# Patient Record
Sex: Female | Born: 1985 | Race: Black or African American | Hispanic: No | Marital: Married | Smoking: Former smoker
Health system: Southern US, Community
[De-identification: ages and names within clinical notes are randomized; demographics above are authoritative.]

## PROBLEM LIST (undated history)

## (undated) DIAGNOSIS — A6009 Herpesviral infection of other urogenital tract: Secondary | ICD-10-CM

## (undated) DIAGNOSIS — J45909 Unspecified asthma, uncomplicated: Secondary | ICD-10-CM

## (undated) DIAGNOSIS — E559 Vitamin D deficiency, unspecified: Secondary | ICD-10-CM

## (undated) DIAGNOSIS — F419 Anxiety disorder, unspecified: Secondary | ICD-10-CM

## (undated) HISTORY — DX: Vitamin D deficiency, unspecified: E55.9

## (undated) HISTORY — DX: Anxiety disorder, unspecified: F41.9

## (undated) HISTORY — DX: Unspecified asthma, uncomplicated: J45.909

---

## 2013-12-18 ENCOUNTER — Encounter (HOSPITAL_BASED_OUTPATIENT_CLINIC_OR_DEPARTMENT_OTHER): Payer: Self-pay | Admitting: *Deleted

## 2013-12-18 ENCOUNTER — Emergency Department (HOSPITAL_BASED_OUTPATIENT_CLINIC_OR_DEPARTMENT_OTHER): Payer: BC Managed Care – PPO

## 2013-12-18 ENCOUNTER — Emergency Department (HOSPITAL_BASED_OUTPATIENT_CLINIC_OR_DEPARTMENT_OTHER)
Admission: EM | Admit: 2013-12-18 | Discharge: 2013-12-18 | Disposition: A | Discharge status: Home / Self Care | Payer: BC Managed Care – PPO | Attending: Emergency Medicine | Admitting: Emergency Medicine

## 2013-12-18 DIAGNOSIS — R0602 Shortness of breath: Secondary | ICD-10-CM | POA: Diagnosis not present

## 2013-12-18 DIAGNOSIS — Z87891 Personal history of nicotine dependence: Secondary | ICD-10-CM | POA: Diagnosis not present

## 2013-12-18 DIAGNOSIS — R062 Wheezing: Secondary | ICD-10-CM | POA: Insufficient documentation

## 2013-12-18 DIAGNOSIS — Z880 Allergy status to penicillin: Secondary | ICD-10-CM | POA: Insufficient documentation

## 2013-12-18 DIAGNOSIS — Z8619 Personal history of other infectious and parasitic diseases: Secondary | ICD-10-CM | POA: Insufficient documentation

## 2013-12-18 DIAGNOSIS — Z79899 Other long term (current) drug therapy: Secondary | ICD-10-CM | POA: Insufficient documentation

## 2013-12-18 HISTORY — DX: Herpesviral infection of other urogenital tract: A60.09

## 2013-12-18 MED ORDER — PREDNISONE 50 MG PO TABS
60.0000 mg | ORAL_TABLET | Freq: Once | ORAL | Status: AC
  Administered 2013-12-18: 60 mg via ORAL
  Filled 2013-12-18 (×2): qty 1

## 2013-12-18 MED ORDER — ALBUTEROL SULFATE HFA 108 (90 BASE) MCG/ACT IN AERS
2.0000 | INHALATION_SPRAY | Freq: Four times a day (QID) | RESPIRATORY_TRACT | Status: DC | PRN
Start: 2013-12-18 — End: 2016-08-22

## 2013-12-18 MED ORDER — GUAIFENESIN-CODEINE 100-10 MG/5ML PO SYRP: 5.0000 mL | ORAL_SOLUTION | Freq: Three times a day (TID) | ORAL | Status: DC | PRN

## 2013-12-18 MED ORDER — PREDNISONE 20 MG PO TABS: ORAL_TABLET | ORAL | Status: DC

## 2013-12-18 NOTE — ED Notes (Signed)
Patient states she has been SOB for the past few days.

## 2013-12-18 NOTE — ED Provider Notes (Signed)
CSN: [id number]     Arrival date & time 12/18/13  1829 History   First MD Initiated Contact with Patient 12/18/13 1912     Chief Complaint  Patient presents with  . Shortness of Breath     (Consider location/radiation/quality/duration/timing/severity/associated sxs/prior Treatment) HPI   28 year old female presents complaining of shortness of breath. Patient reports for the nearly 2 weeks she has had recurrent episodes of increased shortness of breath and wheezing. Symptoms waxing and waning but she is using her rescue inhaler more than usual. In the past she would use her inhaler as needed which is once every 1-2 weeks however for the past 3-4 days she is using at least 3-4 times a day with some improvement. She reports a nonproductive cough, nasal congestion, runny nose. She denies any environmental changes but states that she thinks the weather is the controlled factor. Her friend has a dog that she has been exposed for the past 2 days. She denies any prior history of PE or DVT, no recent surgery, prolonged bed rest, unilateral leg swelling or calf pain, hemoptysis, or active cancer. Patient is currently on birth control. Patient states at one point she was diagnosed with asthma.  Past Medical History  Diagnosis Date  . Herpes genitalis in women    History reviewed. No pertinent past surgical history. No family history on file. History  Substance Use Topics  . Smoking status: Former Games developermoker  . Smokeless tobacco: Not on file  . Alcohol Use: No   OB History    No data available     Review of Systems  All other systems reviewed and are negative.     Allergies  Penicillins  Home Medications   Prior to Admission medications   Medication Sig Start Date End Date Taking? Authorizing Provider  albuterol (PROVENTIL HFA;VENTOLIN HFA) 108 (90 BASE) MCG/ACT inhaler Inhale 2 puffs into the lungs every 6 (six) hours as needed for wheezing or shortness of breath.   Yes Historical  Provider, MD  valACYclovir (VALTREX) 500 MG tablet Take 500 mg by mouth 2 (two) times daily.   Yes Historical Provider, MD   BP 134/82 mmHg  Pulse 86  Temp(Src) 97.6 F (36.4 C) (Oral)  Resp 18  Ht 5\' 3"  (1.6 m)  Wt 230 lb (104.327 kg)  BMI 40.75 kg/m2  SpO2 100% Physical Exam  Constitutional: She appears well-developed and well-nourished. No distress.  HENT:  Head: Atraumatic.  Right Ear: External ear normal.  Left Ear: External ear normal.  Rhinorrhea  Eyes: Conjunctivae are normal.  Neck: Neck supple.  Cardiovascular: Normal rate, regular rhythm and intact distal pulses.   Pulmonary/Chest: Effort normal. No respiratory distress. She has wheezes (Very faint expiratory wheezes without rales or rhonchi).  Abdominal: Soft. There is no tenderness.  Musculoskeletal: She exhibits no edema.  Neurological: She is alert.  Skin: No rash noted.  Psychiatric: She has a normal mood and affect.  Nursing note and vitals reviewed.   ED Course  Procedures (including critical care time)  8:40 PM Patient presents with shortness of breath for the past 10 day and occasional wheezing having to use her inhaler more than usual. This is likely to be a asthma exacerbation. Her chest x-ray shows no acute infiltrate concerning for pneumonia. Other she has no significant risk factors for PE aside from taking birth control pill given that her heart rate is normal, no evidence of hypoxia.  I discussed options of obtaining a d-dimer, however patient prefers watchful waiting.  We'll treat patients with steroid, inhaler, cough medication and strong return precaution given. Patient voiced understanding and agrees with plan. Patient will follow with her PCP as needed.  Labs Review Labs Reviewed - No data to display  Imaging Review Dg Chest 2 View  12/18/2013   CLINICAL DATA:  Short of breath for several days  EXAM: CHEST  2 VIEW  COMPARISON:  None.  FINDINGS: Normal mediastinum and cardiac silhouette. Normal  pulmonary vasculature. No evidence of effusion, infiltrate, or pneumothorax. No acute bony abnormality.  IMPRESSION: Normal chest radiograph   Electronically Signed   By: Genevive BiStewart  Edmunds M.D.   On: 12/18/2013 19:57     EKG Interpretation None      MDM   Final diagnoses:  SOB (shortness of breath)    BP 134/82 mmHg  Pulse 86  Temp(Src) 97.6 F (36.4 C) (Oral)  Resp 18  Ht 5\' 3"  (1.6 m)  Wt 230 lb (104.327 kg)  BMI 40.75 kg/m2  SpO2 100%  LMP 11/21/2013 (Approximate)  I have reviewed nursing notes and vital signs. I personally reviewed the imaging tests through PACS system  I reviewed available ER/hospitalization records thought the EMR     Fayrene HelperBowie Tyliek Timberman, PA-C 12/18/13 2043  Vanetta MuldersScott Zackowski, MD 12/19/13 1910

## 2013-12-18 NOTE — Discharge Instructions (Signed)

## 2016-07-14 ENCOUNTER — Ambulatory Visit: Payer: BC Managed Care – PPO | Admitting: Allergy & Immunology

## 2016-08-22 ENCOUNTER — Encounter: Payer: Self-pay | Admitting: Allergy

## 2016-08-22 ENCOUNTER — Ambulatory Visit (INDEPENDENT_AMBULATORY_CARE_PROVIDER_SITE_OTHER): Payer: BC Managed Care – PPO | Admitting: Allergy

## 2016-08-22 VITALS — BP 128/72 | HR 68 | Temp 97.8°F | Resp 19 | Ht 63.5 in | Wt 252.0 lb

## 2016-08-22 DIAGNOSIS — J310 Chronic rhinitis: Secondary | ICD-10-CM | POA: Diagnosis not present

## 2016-08-22 DIAGNOSIS — J309 Allergic rhinitis, unspecified: Secondary | ICD-10-CM | POA: Diagnosis not present

## 2016-08-22 DIAGNOSIS — H1010 Acute atopic conjunctivitis, unspecified eye: Secondary | ICD-10-CM | POA: Diagnosis not present

## 2016-08-22 DIAGNOSIS — J4530 Mild persistent asthma, uncomplicated: Secondary | ICD-10-CM | POA: Diagnosis not present

## 2016-08-22 DIAGNOSIS — T781XXD Other adverse food reactions, not elsewhere classified, subsequent encounter: Secondary | ICD-10-CM

## 2016-08-22 MED ORDER — MONTELUKAST SODIUM 10 MG PO TABS: 10.0000 mg | ORAL_TABLET | Freq: Every day | ORAL | 5 refills | Status: DC

## 2016-08-22 MED ORDER — ALBUTEROL SULFATE HFA 108 (90 BASE) MCG/ACT IN AERS: 2.0000 | INHALATION_SPRAY | Freq: Four times a day (QID) | RESPIRATORY_TRACT | 5 refills | Status: DC | PRN

## 2016-08-22 MED ORDER — LEVOCETIRIZINE DIHYDROCHLORIDE 5 MG PO TABS: 5.0000 mg | ORAL_TABLET | Freq: Every evening | ORAL | 5 refills | Status: DC

## 2016-08-22 MED ORDER — TRIAMCINOLONE ACETONIDE 55 MCG/ACT NA AERO: 2.0000 | INHALATION_SPRAY | Freq: Every day | NASAL | 5 refills | Status: DC

## 2016-08-22 MED ORDER — IPRATROPIUM BROMIDE 0.06 % NA SOLN: 2.0000 | Freq: Four times a day (QID) | NASAL | 5 refills | Status: DC

## 2016-08-22 NOTE — Patient Instructions (Addendum)
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   Your milk serum IgE and cheese (moldy type) IgE were negative and skin testing to milk is negative.  Thus you do not have IgE mediated food allergy to dairy.     - skin testing for tomato and shrimp were negative but you do have serum IgE detectable levels to both tomato and shrimp thus this could represent IgE mediated food allergy.  Would avoid these foods at this time and have access to your Epipen.      - will prescribe nasal Atrovent to use 2 sprays as needed up to 3-4 times a day.  Use prior to known cheese ingestion.    Exercise-induced asthma/Reactive airway disease   - start Singulair 10mg  daily-- take at bedtime   - have access to albuterol inhaler 2 puffs every 4-6 hours as needed for cough/wheeze/shortness of breath/chest tightness.  May use 15-20 minutes prior to activity.   Monitor frequency of use.   Asthma control goals:   Full participation in all desired activities (may need albuterol before activity)  Albuterol use two time or less a week on average (not counting use with activity)  Cough interfering with sleep two time or less a month  Oral steroids no more than once a year  No hospitalizations   Allergic rhinitis    - stop Claritin and change to Xyzal 5mg  daily    - for nasal congestion and drainagrecommend use Nasacort 2 sprays each nostril daily    - allergy skin prick testing was positive for grasses, weeds, trees, 1 mold (botrytis cinera), dust mites, dog ,cockroach.   Allergen avoidance measures provided.    Follow-up 6 months or sooner if needed

## 2016-08-22 NOTE — Progress Notes (Signed)
New Patient Note  RE: Belinda Barton MRN: [id number] DOB: Dec 11, 1985 Date of Office Visit: 08/22/2016  Referring provider: Dorothyann PengSanders, Robyn, MD Primary care provider: Dorothyann PengSanders, Robyn, MD  Chief Complaint: allergies    History of present illness: Belinda Barton is a 31 y.o. female presenting today for consultation for allergies.   She states around age 31 she started having issues with certain foods causing symptoms.   She reports with any type of cheese ingestion she gets increased nasal drainage.  In the winter time she reports after cheese ingestion she can not breathe.  She still likes to eat cheese.   She reports even with cream cheese and sour cream she will experience the symptoms.  Tomato based sauces she would develop heartburn symptoms, stomach pain and nausea.  Shrimp ingestion makes her stomach hurt as well and she developed increased flatulence.   She eats other shellfish without any issues.  She does try to stay away from tomato-based products and shrimp as much as possible. She had blood testing done for food allergy in 10/17 and many foods on the panel had detectable IgE levels (see results below) even though she eats most of these foods without any symptoms.   She was prescribed an Epipen which she carries with her.   She reports nasal congestion and drainage at other times outside of cheese ingestion however does not notice a seasonal change.  She also notes itchy, watery eyes around her friend's dog and she will usually just leave when symptoms start.  She has been taking daily Claritin and feels it is not that helpful.    She has noticed when seasons change especially in winter she has more wheezing and difficulty breathing.    She also feels her work environment may be playing a role as it is old and she reports that sometimes down.  She has a history of exercise induced asthma.  She does not have albuterol inhaler and had to borrow someone else's albuterol which she did find  relief this past winter.  She was prescribed an albuterol inhaler from her PCP and April however she did not fill this.q She did need to go to ED 3 years ago for wheezing during winter where she was treated with breathing treatments..   She denied having the steroids prescribed to her at this ED visit as she was told there may be some interaction with her birth control. She denies any nighttime awakenings.  She has never been on a daily controller medicine.   Review of systems: Review of Systems  Constitutional: Negative for chills and fever.  HENT: Positive for congestion. Negative for ear discharge, ear pain, nosebleeds, sinus pain, sore throat and tinnitus.   Eyes: Negative for discharge and redness.  Respiratory: Negative for cough, shortness of breath and wheezing.   Cardiovascular: Negative for chest pain.  Gastrointestinal: Positive for heartburn. Negative for abdominal pain, constipation, diarrhea, nausea and vomiting.  Musculoskeletal: Negative for joint pain.  Skin: Negative for itching and rash.  Neurological: Negative for headaches.    All other systems negative unless noted above in HPI  Past medical history: Past Medical History:  Diagnosis Date  . Herpes genitalis in women   . Vitamin D deficiency     Past surgical history: History reviewed. No pertinent surgical history.  Family history:  Family History  Problem Relation Age of Onset  . Diabetes Mother   . Asthma Brother     Social history: She lives in a  condo with carpeting in the bedroom with gas heating and central cooling. There are no pets in the home. There are dogs and cats outside the home. There is no concern for water damage, mild to her wishes in the home. She is a Runner, broadcasting/film/videoteacher.  She does smoke clover ~ 1 pack/week for past 4 years.     Medication List: Allergies as of 08/22/2016      Reactions   Penicillins       Medication List       Accurate as of 08/22/16  1:06 PM. Always use your most recent  med list.          JUNEL FE 1/20 1-20 MG-MCG tablet Generic drug:  norethindrone-ethinyl estradiol TAKE 1 TABLET BY MOUTH EVERY DAY FOR 21 DAYS, THEN DO NOT TAKE PLACEBO PILLS, TABLET-FREE FOR 7 DAYS   valACYclovir 500 MG tablet Commonly known as:  VALTREX Take 500 mg by mouth 2 (two) times daily.   Vitamin D (Ergocalciferol) 50000 units Caps capsule Commonly known as:  DRISDOL       Known medication allergies: Allergies  Allergen Reactions  . Penicillins      Physical examination: Blood pressure 128/72, pulse 68, temperature 97.8 F (36.6 C), resp. rate 19, height 5' 3.5" (1.613 m), weight 252 lb (114.3 kg), SpO2 97 %.  General: Alert, interactive, in no acute distress. HEENT: PERRLA, TMs pearly gray, turbinates markedly edematous and pale with clear discharge, post-pharynx non erythematous. Neck: Supple without lymphadenopathy. Lungs: Clear to auscultation without wheezing, rhonchi or rales. {no increased work of breathing. CV: Normal S1, S2 without murmurs. Abdomen: Nondistended, nontender. Skin: Warm and dry, without lesions or rashes. Extremities:  No clubbing, cyanosis or edema. Neuro:   Grossly intact.  Diagnositics/Labs: Labs: Review of labs obtained by PCP from October 2017 include low vitamin D level, normal TSH, unremarkable CMP, unremarkable CBC.   Food allergy serum IgE levels were detected at moderate to low levels for wheat, rye, oat, corn, rice, peanut, soybean, almond, carrot, orange, white potato, strawberry, garlic, onions, apple, melons, banana, avocado, lemon, cabbage, grape, broccoli, chilli pepper, black pepper, green bean  -- all of which she eats and tolerates There was negative serum IgE testing to both milk and cheese, moldy type. There is detectable level for shrimp at 0.16, tomato at 0.6 Please see scanned document for detailed lab results  Spirometry: FEV1: 1.97L  79%, FVC: 2.7L  93%, ratio consistent with Nonobstructive pattern  Allergy  testing: Skin prick testing for selected foods were negative for milk, tomato and shrimp Environmental allergy skin prick testing is positive for grasses, trees, dust mite, dog botytis cinera,and  Cockroach Intradermal testing was positive for ragweed mix and weed mix Allergy testing results were read and interpreted by provider, documented by clinical staff.   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   Your milk serum IgE and cheese (moldy type) IgE were negative and skin testing to milk is negative.  Thus you do not have IgE mediated food allergy to dairy.     - skin testing for tomato and shrimp were negative but you do have serum IgE detectable levels to both tomato and shrimp thus this could represent IgE mediated food allergy.  Would avoid these foods at this time and have access to your Epipen.  Emergency action plan provided today.    - Advised that all of the other foods  that have low level detectable IgE levels are likely false positives as she consumes these foods without any issue and to resume in the diet    - will prescribe nasal Atrovent to use 2 sprays as needed up to 3-4 times a day.  Use prior to known cheese ingestion.    Exercise-induced asthma/Reactive airway disease   - start Singulair 10mg  daily-- take at bedtime   - have access to albuterol inhaler 2 puffs every 4-6 hours as needed for cough/wheeze/shortness of breath/chest tightness.  May use 15-20 minutes prior to activity.   Monitor frequency of use.   Asthma control goals:   Full participation in all desired activities (may need albuterol before activity)  Albuterol use two time or less a week on average (not counting use with activity)  Cough interfering with sleep two time or less a month  Oral steroids no more than once a year  No hospitalizations   Allergic rhinoconjunctivitis    - stop Claritin and change to  Xyzal 5mg  daily    - for nasal congestion and drainagrecommend use Nasacort 2 sprays each nostril daily    - allergy skin prick testing was positive for grasses, weeds, trees, 1 mold (botrytis cinera), dust mites, dog ,cockroach.   Allergen avoidance measures provided.      - Offered to prescribe an antihistamine eyedrop for possible dog exposure however she states it only happens with her friend's dog and she can avoidthe dog  Follow-up 6 months or sooner if needed  I appreciate the opportunity to take part in Licia's care. Please do not hesitate to contact me with questions.  Sincerely,   Margo Aye, MD Allergy/Immunology [facility]

## 2016-08-26 ENCOUNTER — Telehealth: Payer: Self-pay

## 2016-08-26 MED ORDER — ALBUTEROL SULFATE 108 (90 BASE) MCG/ACT IN AEPB: 2.0000 | INHALATION_SPRAY | Freq: Four times a day (QID) | RESPIRATORY_TRACT | 1 refills | Status: DC | PRN

## 2016-08-26 NOTE — Telephone Encounter (Signed)
Left detailed message asking for call back about medications.

## 2016-08-26 NOTE — Telephone Encounter (Signed)
Do you know if she is referring to the Proair respiclick not working or Magazine features editorall Proair.  If she doesn't feel the respiclick didn't work it could be the device and would go ahead and order the Proair HFA otherwise can order the levalbuterol if covered.

## 2016-08-26 NOTE — Telephone Encounter (Signed)
Patient returned call. Stated that she had the hfa inhaler before, she believes. Is willing to try the respiclick. I have sent that to her pharmacy. She will let me know if she is okay with this one or not.

## 2016-08-26 NOTE — Telephone Encounter (Signed)
Patient's insurance does not cover Ventolin HFA. Insurance prefers levalbuterol aer 45/act, Proair HFA, Proair Respi Aer. Patient prefers ventolin because she states that the Avon ProductsProair doesn't work.    Please advise.

## 2016-08-26 NOTE — Addendum Note (Signed)
Addended by: Mliss FritzBLACK, Berneita Sanagustin I on: 08/26/2016 05:22 PM   Modules accepted: Orders

## 2017-02-23 ENCOUNTER — Other Ambulatory Visit: Payer: Self-pay

## 2017-02-23 DIAGNOSIS — J4530 Mild persistent asthma, uncomplicated: Secondary | ICD-10-CM

## 2017-02-23 DIAGNOSIS — J309 Allergic rhinitis, unspecified: Principal | ICD-10-CM

## 2017-02-23 DIAGNOSIS — H1010 Acute atopic conjunctivitis, unspecified eye: Secondary | ICD-10-CM

## 2017-02-23 MED ORDER — LEVOCETIRIZINE DIHYDROCHLORIDE 5 MG PO TABS: 5.0000 mg | ORAL_TABLET | Freq: Every evening | ORAL | 0 refills | Status: DC

## 2017-02-23 MED ORDER — MONTELUKAST SODIUM 10 MG PO TABS: 10.0000 mg | ORAL_TABLET | Freq: Every day | ORAL | 0 refills | Status: DC

## 2017-02-23 NOTE — Telephone Encounter (Signed)
Courtesy refill sent. Pt needs an office visit.

## 2017-03-27 ENCOUNTER — Other Ambulatory Visit: Payer: Self-pay | Admitting: Allergy

## 2017-03-27 DIAGNOSIS — J309 Allergic rhinitis, unspecified: Principal | ICD-10-CM

## 2017-03-27 DIAGNOSIS — J4530 Mild persistent asthma, uncomplicated: Secondary | ICD-10-CM

## 2017-03-27 DIAGNOSIS — H1010 Acute atopic conjunctivitis, unspecified eye: Secondary | ICD-10-CM

## 2017-05-07 ENCOUNTER — Telehealth: Payer: Self-pay | Admitting: Allergy

## 2017-05-07 DIAGNOSIS — H1010 Acute atopic conjunctivitis, unspecified eye: Secondary | ICD-10-CM

## 2017-05-07 DIAGNOSIS — J4530 Mild persistent asthma, uncomplicated: Secondary | ICD-10-CM

## 2017-05-07 DIAGNOSIS — J309 Allergic rhinitis, unspecified: Principal | ICD-10-CM

## 2017-05-07 MED ORDER — LEVOCETIRIZINE DIHYDROCHLORIDE 5 MG PO TABS: 5.0000 mg | ORAL_TABLET | Freq: Every evening | ORAL | 0 refills | Status: DC

## 2017-05-07 MED ORDER — ALBUTEROL SULFATE 108 (90 BASE) MCG/ACT IN AEPB: 2.0000 | INHALATION_SPRAY | Freq: Four times a day (QID) | RESPIRATORY_TRACT | 1 refills | Status: DC | PRN

## 2017-05-07 MED ORDER — MONTELUKAST SODIUM 10 MG PO TABS: 10.0000 mg | ORAL_TABLET | Freq: Every day | ORAL | 0 refills | Status: DC

## 2017-05-07 NOTE — Telephone Encounter (Signed)
Refilled 3 medications for patient

## 2017-05-07 NOTE — Telephone Encounter (Signed)
Patient needs a refill called in for all medications - pharmacy told the pat to contact the office to get refills Patient has upcoming appt 05/21/2017

## 2017-05-21 ENCOUNTER — Ambulatory Visit: Payer: BC Managed Care – PPO | Admitting: Allergy

## 2017-05-21 ENCOUNTER — Encounter: Payer: Self-pay | Admitting: Allergy

## 2017-05-21 VITALS — BP 124/84 | HR 72 | Resp 16

## 2017-05-21 DIAGNOSIS — H1010 Acute atopic conjunctivitis, unspecified eye: Secondary | ICD-10-CM

## 2017-05-21 DIAGNOSIS — J4530 Mild persistent asthma, uncomplicated: Secondary | ICD-10-CM

## 2017-05-21 DIAGNOSIS — J310 Chronic rhinitis: Secondary | ICD-10-CM

## 2017-05-21 DIAGNOSIS — J309 Allergic rhinitis, unspecified: Secondary | ICD-10-CM | POA: Diagnosis not present

## 2017-05-21 NOTE — Patient Instructions (Addendum)
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    Your milk serum IgE and cheese (moldy type) IgE were negative and skin testing to milk is negative.  Thus you do not have IgE mediated food allergy to dairy.     - skin testing previously for tomato and shrimp were negative but you did have serum IgE detectable levels to both tomato and shrimp thus this could represent IgE mediated food allergy.  Would avoid these foods at this time and have access to your Epipen.      -use nasal Atrovent 2 sprays as needed up to 3-4 times a day if your going to eat cheese or other foods or situations where you develop nasal congestion and drainage.    Exercise-induced asthma/Reactive airway disease   - continue Singulair 10mg  daily-- take at bedtime   - have access to albuterol inhaler 2 puffs every 4-6 hours as needed for cough/wheeze/shortness of breath/chest tightness.  May use 15-20 minutes prior to activity.   Monitor frequency of use.   Asthma control goals:   Full participation in all desired activities (may need albuterol before activity)  Albuterol use two time or less a week on average (not counting use with activity)  Cough interfering with sleep two time or less a month  Oral steroids no more than once a year  No hospitalizations  Allergic rhinitis    - continue  Xyzal 5mg  daily    - for nasal congestion and drainage recommend use of Nasacort 2 sprays each nostril daily    - continue allergen avoidance grasses, weeds, trees, mold (botrytis cinera), dust mites, dog ,cockroach.     Follow-up 6 months or sooner if needed

## 2017-05-21 NOTE — Progress Notes (Signed)
Follow-up Note  RE: Belinda Barton MRN: [id number] DOB: [birth year] Date of Office Visit: 05/21/2017   History of present illness: Belinda Barton is a 32 y.o. female presenting today for follow-up of gustatory rhinitis, exercise-induced asthma, allergic rhinocnjunctivitis.  She was last seen in the office on 08/22/16 by myself.  She denies any major health changes, surgeries or hospitalizations in the past year.  She does states it is hard for her to stay away from cheese.  She has been using which she thinks is atrovent nasal spray and states it does dry up her nasal secretions with dairy ingestion.   She states she definitely can tell as difference in her allergy symptoms with use of Xyzal and singulair.  She states she did run out of medications about a month or so ago and was able to get a courtesy refill from the office.  She states when she was out of the meds briefly she had wheezing.  This has improved with resuming her medication management.  She states she has needed to use albuterol about once a week.  Denies any nighttime awakenings.  No ED/UC visits and no oral steroid needs since last visit.   She does not think she has the Nasacort as she only has 1 nasal spray which she states was a prescription.  She also continues to avoid her friend's home who has a dog that will usually will cause her to have itchy/watery eyes.  Since she has been avoiding this home she has not needed to use an eyedrop.     Review of systems: Review of Systems  Constitutional: Negative for chills, fever and malaise/fatigue.  HENT: Positive for congestion. Negative for ear discharge, ear pain, nosebleeds, sinus pain and sore throat.   Eyes: Negative for pain, discharge and redness.  Respiratory: Positive for wheezing. Negative for cough, sputum production and shortness of breath.   Cardiovascular: Negative for chest pain.  Gastrointestinal: Negative for abdominal pain, constipation, diarrhea, heartburn,  nausea and vomiting.  Musculoskeletal: Negative for joint pain.  Skin: Negative for itching and rash.  Neurological: Negative for headaches.    All other systems negative unless noted above in HPI  Past medical/social/surgical/family history have been reviewed and are unchanged unless specifically indicated below.  No changes  Medication List: Allergies as of 05/21/2017      Reactions   Penicillins       Medication List        Accurate as of 05/21/17  6:05 PM. Always use your most recent med list.          Albuterol Sulfate 108 (90 Base) MCG/ACT Aepb Commonly known as:  PROAIR RESPICLICK Inhale 2 puffs into the lungs every 6 (six) hours as needed.   ipratropium 0.06 % nasal spray Commonly known as:  ATROVENT Place 2 sprays into both nostrils 4 (four) times daily.   JUNEL FE 1/20 1-20 MG-MCG tablet Generic drug:  norethindrone-ethinyl estradiol TAKE 1 TABLET BY MOUTH EVERY DAY FOR 21 DAYS, THEN DO NOT TAKE PLACEBO PILLS, TABLET-FREE FOR 7 DAYS   levocetirizine 5 MG tablet Commonly known as:  XYZAL Take 1 tablet (5 mg total) by mouth every evening.   montelukast 10 MG tablet Commonly known as:  SINGULAIR Take 1 tablet (10 mg total) by mouth at bedtime.   triamcinolone 55 MCG/ACT Aero nasal inhaler Commonly known as:  NASACORT Place 2 sprays into the nose daily.   valACYclovir 500 MG tablet Commonly known as:  VALTREX Take 500  mg by mouth 2 (two) times daily.   Vitamin D (Ergocalciferol) 50000 units Caps capsule Commonly known as:  DRISDOL       Known medication allergies: Allergies  Allergen Reactions  . Penicillins      Physical examination: Blood pressure 124/84, pulse 72, resp. rate 16.  General: Alert, interactive, in no acute distress. HEENT: PERRLA, TMs pearly gray, turbinates mildly edematous with clear discharge, post-pharynx non erythematous. Neck: Supple without lymphadenopathy. Lungs: Clear to auscultation without wheezing, rhonchi or  rales. {no increased work of breathing. CV: Normal S1, S2 without murmurs. Abdomen: Nondistended, nontender. Skin: Warm and dry, without lesions or rashes. Extremities:  No clubbing, cyanosis or edema. Neuro:   Grossly intact.  Diagnositics/Labs:  Spirometry: FEV1: 2.28L  83%, FVC: 2.88L  89%, ratio consistent with nonobstructive pattern  Assessment and plan:   Adverse food reaction/GUSTATORY RHINITIS     - 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    Your milk serum IgE and cheese (moldy type) IgE were negative and skin testing to milk is negative.  Thus you do not have IgE mediated food allergy to dairy.     - skin testing previously for tomato and shrimp were negative but you did have serum IgE detectable levels to both tomato and shrimp thus this could represent IgE mediated food allergy.  Would avoid these foods at this time and have access to your Epipen.      -use nasal Atrovent 2 sprays as needed up to 3-4 times a day if your going to eat cheese or other foods or situations where you develop nasal congestion and drainage.    Exercise-induced asthma/Reactive airway disease   - continue Singulair 10mg  daily-- take at bedtime   - have access to albuterol inhaler 2 puffs every 4-6 hours as needed for cough/wheeze/shortness of breath/chest tightness.  May use 15-20 minutes prior to activity.   Monitor frequency of use.   Asthma control goals:   Full participation in all desired activities (may need albuterol before activity)  Albuterol use two time or less a week on average (not counting use with activity)  Cough interfering with sleep two time or less a month  Oral steroids no more than once a year  No hospitalizations  Allergic rhinitis    - continue  Xyzal 5mg  daily    - for nasal congestion and drainage recommend use of Nasacort 2 sprays each nostril daily    - continue allergen avoidance grasses, weeds, trees, mold (botrytis cinera), dust mites, dog  ,cockroach.     Follow-up 6 months or sooner if needed  I appreciate the opportunity to take part in Mallorie's care. Please do not hesitate to contact me with questions.  Sincerely,   Margo AyeShaylar Haylyn Halberg, MD Allergy/Immunology [facility]

## 2017-05-22 NOTE — Addendum Note (Signed)
Addended by: Dub MikesHICKS, Terrius Gentile N on: 05/22/2017 04:05 PM   Modules accepted: Orders

## 2017-06-11 ENCOUNTER — Other Ambulatory Visit: Payer: Self-pay | Admitting: Allergy

## 2017-06-11 ENCOUNTER — Other Ambulatory Visit: Payer: Self-pay | Admitting: *Deleted

## 2017-06-11 DIAGNOSIS — J4530 Mild persistent asthma, uncomplicated: Secondary | ICD-10-CM

## 2017-06-11 DIAGNOSIS — H1010 Acute atopic conjunctivitis, unspecified eye: Secondary | ICD-10-CM

## 2017-06-11 DIAGNOSIS — J309 Allergic rhinitis, unspecified: Principal | ICD-10-CM

## 2017-06-11 MED ORDER — MONTELUKAST SODIUM 10 MG PO TABS: 10.0000 mg | ORAL_TABLET | Freq: Every day | ORAL | 5 refills | Status: DC

## 2017-06-11 MED ORDER — LEVOCETIRIZINE DIHYDROCHLORIDE 5 MG PO TABS
5.0000 mg | ORAL_TABLET | Freq: Every evening | ORAL | 5 refills | Status: DC
Start: 2017-06-11 — End: 2018-04-28

## 2017-11-10 ENCOUNTER — Other Ambulatory Visit: Payer: Self-pay | Admitting: Nurse Practitioner

## 2017-11-20 ENCOUNTER — Encounter: Payer: Self-pay | Admitting: Allergy

## 2017-11-20 ENCOUNTER — Ambulatory Visit (INDEPENDENT_AMBULATORY_CARE_PROVIDER_SITE_OTHER): Payer: BC Managed Care – PPO | Admitting: Allergy

## 2017-11-20 VITALS — BP 124/90 | HR 70 | Resp 20 | Ht 63.0 in | Wt 255.2 lb

## 2017-11-20 DIAGNOSIS — T781XXD Other adverse food reactions, not elsewhere classified, subsequent encounter: Secondary | ICD-10-CM

## 2017-11-20 DIAGNOSIS — J310 Chronic rhinitis: Secondary | ICD-10-CM | POA: Diagnosis not present

## 2017-11-20 DIAGNOSIS — J4530 Mild persistent asthma, uncomplicated: Secondary | ICD-10-CM | POA: Diagnosis not present

## 2017-11-20 DIAGNOSIS — H1010 Acute atopic conjunctivitis, unspecified eye: Secondary | ICD-10-CM

## 2017-11-20 DIAGNOSIS — J309 Allergic rhinitis, unspecified: Secondary | ICD-10-CM | POA: Diagnosis not present

## 2017-11-20 MED ORDER — ALBUTEROL SULFATE HFA 108 (90 BASE) MCG/ACT IN AERS: 2.0000 | INHALATION_SPRAY | Freq: Four times a day (QID) | RESPIRATORY_TRACT | 1 refills | Status: DC | PRN

## 2017-11-20 MED ORDER — EPINEPHRINE 0.3 MG/0.3ML IJ SOAJ: 0.3000 mg | Freq: Once | INTRAMUSCULAR | 2 refills | Status: AC

## 2017-11-20 MED ORDER — BECLOMETHASONE DIPROP HFA 80 MCG/ACT IN AERB: 2.0000 | INHALATION_SPRAY | Freq: Two times a day (BID) | RESPIRATORY_TRACT | 5 refills | Status: DC

## 2017-11-20 NOTE — Patient Instructions (Addendum)
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    Your milk serum IgE and cheese (moldy type) IgE were negative and skin testing to milk is negative.  Thus you do not have IgE mediated food allergy to dairy.     - skin testing previously for tomato and shrimp were negative but you did have serum IgE detectable levels to both tomato and shrimp thus this could represent IgE mediated food allergy.  Would avoid these foods at this time and have access to your Epipen.      -use nasal Atrovent 2 sprays as needed up to 3-4 times a day if your going to eat cheese or other foods or situations where you develop nasal congestion and drainage.    Exercise-induced asthma/Reactive airway disease   - continue Singulair 10mg  daily-- take at bedtime   - start Qvar 2 puffs twice a day   - have access to albuterol inhaler 2 puffs every 4-6 hours as needed for cough/wheeze/shortness of breath/chest tightness.  May use 15-20 minutes prior to activity.   Monitor frequency of use.   Asthma control goals:   Full participation in all desired activities (may need albuterol before activity)  Albuterol use two time or less a week on average (not counting use with activity)  Cough interfering with sleep two time or less a month  Oral steroids no more than once a year  No hospitalizations  Allergic rhinitis    - continue  Xyzal 5mg  daily    - for nasal congestion and drainage recommend use of Nasacort 2 sprays each nostril daily    - continue allergen avoidance grasses, weeds, trees, mold (botrytis cinera), dust mites, dog, cockroach.     Follow-up 6 months or sooner if needed

## 2017-11-20 NOTE — Progress Notes (Signed)
Follow-up Note  RE: Belinda Barton MRN: [id number] DOB: 07-12-1985 Date of Office Visit: 11/20/2017   History of present illness: Belinda Barton is a 32 y.o. female presenting today for follow-up of gustatory rhinitis, reactive airway disease and allergic rhinitis.  She was last seen in the office on May 21, 2017 by myself.  She denies any major health changes, surgeries or hospitalizations since her last visit.  She does state that since the weather has become cooler she notices that if she is out in the cool weather she developed shortness of breath.  She states she has needed to use her albuterol about 3 times this week due to the shortness of breath with cold exposure.  She does state that Proventil works the best for her as far as albuterol relief.  She does take Singulair daily.  She states she has not required any ED or urgent care visits or any oral steroid needs since her last visit. She tries her best to avoid cheese products as she knows this will cause nasal drainage.  She states she now seems to have similar symptoms with sour cream.  She also tries to avoid tomato and shrimp.  She does have nasal atrovent which does help sometimes with nasal drainage.    With her allergic rhinitis she states she does not recall if she tried use of xyzal.  She does use nasacort as needed for nasal congestion.    Review of systems: Review of Systems  Constitutional: Negative for chills, fever and malaise/fatigue.  HENT: Positive for congestion. Negative for ear discharge, ear pain, nosebleeds and sore throat.   Eyes: Negative for pain, discharge and redness.  Respiratory: Positive for shortness of breath. Negative for cough, sputum production and wheezing.   Gastrointestinal: Negative for abdominal pain, constipation, diarrhea, heartburn, nausea and vomiting.  Musculoskeletal: Negative for joint pain.  Skin: Negative for itching and rash.  Neurological: Negative for headaches.    All other  systems negative unless noted above in HPI  Past medical/social/surgical/family history have been reviewed and are unchanged unless specifically indicated below.  No changes  Medication List: Allergies as of 11/20/2017      Reactions   Penicillins       Medication List        Accurate as of 11/20/17  4:23 PM. Always use your most recent med list.          Albuterol Sulfate 108 (90 Base) MCG/ACT Aepb Inhale 2 puffs into the lungs every 6 (six) hours as needed.   albuterol 108 (90 Base) MCG/ACT inhaler Commonly known as:  PROVENTIL HFA;VENTOLIN HFA Inhale 2 puffs into the lungs every 6 (six) hours as needed for wheezing or shortness of breath.   beclomethasone 80 MCG/ACT inhaler Commonly known as:  QVAR Inhale 2 puffs into the lungs 2 (two) times daily.   EPINEPHrine 0.3 mg/0.3 mL Soaj injection Commonly known as:  EPI-PEN Inject 0.3 mLs (0.3 mg total) into the muscle once for 1 dose. As directed for life-threatening allergic reactions   ipratropium 0.06 % nasal spray Commonly known as:  ATROVENT Place 2 sprays into both nostrils 4 (four) times daily.   JUNEL FE 1/20 1-20 MG-MCG tablet Generic drug:  norethindrone-ethinyl estradiol TAKE 1 TABLET BY MOUTH EVERY DAY FOR 21 DAYS, THEN DO NOT TAKE PLACEBO PILLS, TABLET-FREE FOR 7 DAYS   levocetirizine 5 MG tablet Commonly known as:  XYZAL Take 1 tablet (5 mg total) by mouth every evening.   levocetirizine  5 MG tablet Commonly known as:  XYZAL TAKE 1 TABLET BY MOUTH EVERY DAY IN THE EVENING   montelukast 10 MG tablet Commonly known as:  SINGULAIR Take 1 tablet (10 mg total) by mouth at bedtime.   montelukast 10 MG tablet Commonly known as:  SINGULAIR TAKE 1 TABLET BY MOUTH EVERYDAY AT BEDTIME   triamcinolone 55 MCG/ACT Aero nasal inhaler Commonly known as:  NASACORT Place 2 sprays into the nose daily.   valACYclovir 500 MG tablet Commonly known as:  VALTREX Take 500 mg by mouth 2 (two) times daily.     Vitamin D (Ergocalciferol) 50000 units Caps capsule Commonly known as:  DRISDOL       Known medication allergies: Allergies  Allergen Reactions  . Penicillins      Physical examination: Blood pressure 124/90, pulse 70, resp. rate 20, height 5\' 3"  (1.6 m), weight 255 lb 3.2 oz (115.8 kg), SpO2 99 %.  General: Alert, interactive, in no acute distress. HEENT: PERRLA, TMs pearly gray, turbinates minimally edematous without discharge, post-pharynx non erythematous. Neck: Supple without lymphadenopathy. Lungs: Clear to auscultation without wheezing, rhonchi or rales. {no increased work of breathing. CV: Normal S1, S2 without murmurs. Abdomen: Nondistended, nontender. Skin: Warm and dry, without lesions or rashes. Extremities:  No clubbing, cyanosis or edema. Neuro:   Grossly intact.  Diagnositics/Labs:  Spirometry: FEV1: 2.13L 81%, FVC: 2.82L 91%, ratio consistent with nonobstructive pattern  Assessment and plan:   Adverse food reaction/GUSTATORY RHINITIS     - 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    Your milk serum IgE and cheese (moldy type) IgE were negative and skin testing to milk was negative.  Thus you do not have IgE mediated food allergy to dairy.     - skin testing previously for tomato and shrimp were negative but you did have serum IgE detectable levels to both tomato and shrimp thus this could represent IgE mediated food allergy.  Would avoid these foods at this time and have access to your Epipen.      -use nasal Atrovent 2 sprays as needed up to 3-4 times a day if your going to eat cheese or other foods or situations where you develop nasal congestion and drainage.    Exercise-induced asthma/Reactive airway disease   - continue Singulair 10mg  daily-- take at bedtime   - start Qvar 2 puffs twice a day   - have access to albuterol inhaler 2 puffs every 4-6 hours as needed for cough/wheeze/shortness of breath/chest tightness.  May use  15-20 minutes prior to activity.   Monitor frequency of use.   Asthma control goals:   Full participation in all desired activities (may need albuterol before activity)  Albuterol use two time or less a week on average (not counting use with activity)  Cough interfering with sleep two time or less a month  Oral steroids no more than once a year  No hospitalizations  Allergic rhinitis    - continue  Xyzal 5mg  daily    - for nasal congestion and drainage recommend use of Nasacort 2 sprays each nostril daily    - continue allergen avoidance grasses, weeds, trees, mold (botrytis cinera), dust mites, dog, cockroach.     Follow-up 6 months or sooner if needed I appreciate the opportunity to take part in Belinda Barton's care. Please do not hesitate to contact me with questions.  Sincerely,   Margo Aye, MD Allergy/Immunology [facility]

## 2017-12-10 ENCOUNTER — Other Ambulatory Visit: Payer: Self-pay | Admitting: *Deleted

## 2017-12-10 DIAGNOSIS — H1010 Acute atopic conjunctivitis, unspecified eye: Secondary | ICD-10-CM

## 2017-12-10 DIAGNOSIS — J309 Allergic rhinitis, unspecified: Principal | ICD-10-CM

## 2017-12-10 MED ORDER — LEVOCETIRIZINE DIHYDROCHLORIDE 5 MG PO TABS: ORAL_TABLET | ORAL | 5 refills | Status: DC

## 2017-12-11 ENCOUNTER — Other Ambulatory Visit: Payer: Self-pay | Admitting: Allergy

## 2017-12-14 ENCOUNTER — Encounter: Payer: BC Managed Care – PPO | Admitting: Nurse Practitioner

## 2018-01-09 ENCOUNTER — Other Ambulatory Visit: Payer: Self-pay | Admitting: Allergy

## 2018-01-11 ENCOUNTER — Encounter: Payer: Self-pay | Admitting: Nurse Practitioner

## 2018-01-11 ENCOUNTER — Ambulatory Visit: Payer: BC Managed Care – PPO | Admitting: Nurse Practitioner

## 2018-01-11 VITALS — BP 132/92 | HR 77 | Temp 98.2°F | Ht 62.4 in | Wt 251.8 lb

## 2018-01-11 DIAGNOSIS — F419 Anxiety disorder, unspecified: Secondary | ICD-10-CM | POA: Insufficient documentation

## 2018-01-11 DIAGNOSIS — G4700 Insomnia, unspecified: Secondary | ICD-10-CM | POA: Insufficient documentation

## 2018-01-11 DIAGNOSIS — R41840 Attention and concentration deficit: Secondary | ICD-10-CM | POA: Diagnosis not present

## 2018-01-11 DIAGNOSIS — R030 Elevated blood-pressure reading, without diagnosis of hypertension: Secondary | ICD-10-CM

## 2018-01-11 DIAGNOSIS — E559 Vitamin D deficiency, unspecified: Secondary | ICD-10-CM

## 2018-01-11 MED ORDER — BUSPIRONE HCL 10 MG PO TABS: 5.0000 mg | ORAL_TABLET | Freq: Every day | ORAL | 1 refills | Status: DC

## 2018-01-11 NOTE — Progress Notes (Signed)
Subjective:     Patient ID: Belinda Barton , female    DOB: 12/20/1985 , 32 y.o.   MRN: [id number]   Chief Complaint  Patient presents with  . Anxiety    patient states she has been having anxiety attacks for the past five years but within the last year she has been having them more frequently. patient states she has been noticing her blood pressure has been elevated.    HPI  She is having trouble with focusing on tasks and it is affecting her at work.  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  Will not complete tasks. Feels like she is just noticing these symptoms.    At home she also has a challenging time completing tasks.    Anxiety  Presents for follow-up visit. Symptoms include insomnia. Patient reports no chest pain, dizziness, nausea or palpitations. Hours of sleep per night: she has not been sleeping for a couple of years. The quality of sleep is poor. Nighttime awakenings: several.    Insomnia  Primary symptoms: fragmented sleep, sleep disturbance, difficulty falling asleep, frequent awakening.  The current episode started more than one year. The problem occurs nightly. The problem has been gradually worsening (sleeps on weekends) since onset. The symptoms are aggravated by work stress, anxiety, caffeine and substance abuse. How many beverages per day that contain caffeine: 0 - 1.  Types of beverages you drink: coffee. The symptoms are relieved by medication (Cbd gummies). Typical bedtime:  11-12 P.M..  Duration of naps:  Two to four hours.  PMH includes: associated symptoms present, hypertension, no depression, family stress or anxiety (Mother moved in with her - permanently), work related stressors, no chronic pain. Prior diagnostic workup includes:  No prior workup.     Past Medical History:  Diagnosis Date  . Asthma   . Herpes genitalis in women   . Vitamin D deficiency      Family History  Problem Relation Age of Onset  . Diabetes Mother   .  Asthma Brother      Current Outpatient Medications:  .  albuterol (PROVENTIL HFA;VENTOLIN HFA) 108 (90 Base) MCG/ACT inhaler, Inhale 2 puffs into the lungs every 6 (six) hours as needed for wheezing or shortness of breath., Disp: 1 Inhaler, Rfl: 1 .  Albuterol Sulfate (PROAIR RESPICLICK) 381 (90 Base) MCG/ACT AEPB, Inhale 2 puffs into the lungs every 6 (six) hours as needed., Disp: 1 each, Rfl: 1 .  beclomethasone (QVAR REDIHALER) 80 MCG/ACT inhaler, Inhale 2 puffs into the lungs 2 (two) times daily., Disp: 1 Inhaler, Rfl: 5 .  ipratropium (ATROVENT) 0.06 % nasal spray, Place 2 sprays into both nostrils 4 (four) times daily., Disp: 15 mL, Rfl: 5 .  JUNEL FE 1/20 1-20 MG-MCG tablet, TAKE 1 TABLET BY MOUTH EVERY DAY FOR 21 DAYS, THEN DO NOT TAKE PLACEBO PILLS, TABLET-FREE FOR 7 DAYS, Disp: , Rfl: 3 .  levocetirizine (XYZAL) 5 MG tablet, Take 1 tablet (5 mg total) by mouth every evening., Disp: 30 tablet, Rfl: 5 .  levocetirizine (XYZAL) 5 MG tablet, TAKE 1 TABLET BY MOUTH EVERY DAY IN THE EVENING, Disp: 90 tablet, Rfl: 1 .  montelukast (SINGULAIR) 10 MG tablet, TAKE 1 TABLET BY MOUTH EVERYDAY AT BEDTIME, Disp: 90 tablet, Rfl: 1 .  triamcinolone (NASACORT) 55 MCG/ACT AERO nasal inhaler, Place 2 sprays into the nose daily., Disp: 1 Bottle, Rfl: 5 .  valACYclovir (VALTREX) 1000 MG tablet, Take 1,000 mg by mouth as needed. ,  Disp: , Rfl:  .  Vitamin D, Ergocalciferol, (DRISDOL) 50000 units CAPS capsule, , Disp: , Rfl:    Allergies  Allergen Reactions  . Cheese Shortness Of Breath    Self diagnosed  . Penicillins      Review of Systems  Respiratory: Negative.   Cardiovascular: Negative for chest pain and palpitations.  Gastrointestinal: Negative for nausea.  Neurological: Negative for dizziness.  Psychiatric/Behavioral: Positive for sleep disturbance. Negative for agitation, behavioral problems and depression. The patient has insomnia.      Today's Vitals   01/11/18 1458  BP: (!) 132/92   Pulse: 77  Temp: 98.2 F (36.8 C)  TempSrc: Oral  SpO2: 98%  Weight: 251 lb 12.8 oz (114.2 kg)  Height: 5' 2.4" (1.585 m)  PainSc: 0-No pain   Body mass index is 45.47 kg/m.   Objective:  Physical Exam Vitals signs reviewed.  Constitutional:      Appearance: Normal appearance.  Cardiovascular:     Rate and Rhythm: Normal rate and regular rhythm.     Pulses: Normal pulses.     Heart sounds: Normal heart sounds. No murmur.  Skin:    General: Skin is warm and dry.     Capillary Refill: Capillary refill takes less than 2 seconds.  Neurological:     General: No focal deficit present.     Mental Status: She is alert and oriented to person, place, and time.  Psychiatric:        Behavior: Behavior normal.        Thought Content: Thought content normal.        Judgment: Judgment normal.     Comments: Flat affect           Assessment And Plan:     1. Anxiety  Will start her on buspirone for daily anxiety, hopefully this will help her to concentrate better as well.  - busPIRone (BUSPAR) 10 MG tablet; Take 0.5 tablets (5 mg total) by mouth daily.  Dispense: 30 tablet; Refill: 1 - Ambulatory referral to Internal Medicine  2. Insomnia, unspecified type  New, she is having difficulty with completing task and only sleeping approximately 3 hours per night which can potentially be causing her a problem at work.  - Ambulatory referral to Internal Medicine  3. Elevated blood-pressure reading, without diagnosis of hypertension . B/P is elevated today, may be related to her anxious state . I will not start any medications at this time I have encouraged her to eat a healthy diet low in salt and increase her water intake.   Marland Kitchen Also discussed the importance of regular exercise and dietary modification was stressed to the patient. Stressed importance of losing ten percent of her body weight to help with B/P control.  - BMP8+eGFR - Lipid Profile  4. Poor concentration  Difficulty  completing task  Having difficulty with relationships   Beginning to affect her at work  Will refer to Rachel Moulds for evaluation of ADD - Ambulatory referral to Internal Medicine  5. Vitamin D deficiency  Chronic  Will check vitamin d levels   Has not taken a supplement in several months - Vitamin D (25 hydroxy)  Belinda Brine, FNP

## 2018-01-11 NOTE — Patient Instructions (Addendum)
Living With Anxiety  After being diagnosed with an anxiety disorder, you may be relieved to know why you have felt or behaved a certain way. It is natural to also feel overwhelmed about the treatment ahead and what it will mean for your life. With care and support, you can manage this condition and recover from it.  How to cope with anxiety  Dealing with stress  Stress is your body's reaction to life changes and events, both good and bad. Stress can last just a few hours or it can be ongoing. Stress can play a major role in anxiety, so it is important to learn both how to cope with stress and how to think about it differently.  Talk with your health care provider or a counselor to learn more about stress reduction. He or she may suggest some stress reduction techniques, such as:   Music therapy. This can include creating or listening to music that you enjoy and that inspires you.   Mindfulness-based meditation. This involves being aware of your normal breaths, rather than trying to control your breathing. It can be done while sitting or walking.   Centering prayer. This is a kind of meditation that involves focusing on a word, phrase, or sacred image that is meaningful to you and that brings you peace.   Deep breathing. To do this, expand your stomach and inhale slowly through your nose. Hold your breath for 3-5 seconds. Then exhale slowly, allowing your stomach muscles to relax.   Self-talk. This is a skill where you identify thought patterns that lead to anxiety reactions and correct those thoughts.   Muscle relaxation. This involves tensing muscles then relaxing them.    Choose a stress reduction technique that fits your lifestyle and personality. Stress reduction techniques take time and practice. Set aside 5-15 minutes a day to do them. Therapists can offer training in these techniques. The training may be covered by some insurance plans. Other things you can do to manage stress include:   Keeping a  stress diary. This can help you learn what triggers your stress and ways to control your response.   Thinking about how you respond to certain situations. You may not be able to control everything, but you can control your reaction.   Making time for activities that help you relax, and not feeling guilty about spending your time in this way.    Therapy combined with coping and stress-reduction skills provides the best chance for successful treatment.  Medicines  Medicines can help ease symptoms. Medicines for anxiety include:   Anti-anxiety drugs.   Antidepressants.   Beta-blockers.    Medicines may be used as the main treatment for anxiety disorder, along with therapy, or if other treatments are not working. Medicines should be prescribed by a health care provider.  Relationships  Relationships can play a big part in helping you recover. Try to spend more time connecting with trusted friends and family members. Consider going to couples counseling, taking family education classes, or going to family therapy. Therapy can help you and others better understand the condition.  How to recognize changes in your condition  Everyone has a different response to treatment for anxiety. Recovery from anxiety happens when symptoms decrease and stop interfering with your daily activities at home or work. This may mean that you will start to:   Have better concentration and focus.   Sleep better.   Be less irritable.   Have more energy.   Have improved   memory.    It is important to recognize when your condition is getting worse. Contact your health care provider if your symptoms interfere with home or work and you do not feel like your condition is improving.  Where to find help and support:  You can get help and support from these sources:   Self-help groups.   Online and community organizations.   A trusted spiritual leader.   Couples counseling.   Family education classes.   Family therapy.    Follow these  instructions at home:   Eat a healthy diet that includes plenty of vegetables, fruits, whole grains, low-fat dairy products, and lean protein. Do not eat a lot of foods that are high in solid fats, added sugars, or salt.   Exercise. Most adults should do the following:  ? Exercise for at least 150 minutes each week. The exercise should increase your heart rate and make you sweat (moderate-intensity exercise).  ? Strengthening exercises at least twice a week.   Cut down on caffeine, tobacco, alcohol, and other potentially harmful substances.   Get the right amount and quality of sleep. Most adults need 7-9 hours of sleep each night.   Make choices that simplify your life.   Take over-the-counter and prescription medicines only as told by your health care provider.   Avoid caffeine, alcohol, and certain over-the-counter cold medicines. These may make you feel worse. Ask your pharmacist which medicines to avoid.   Keep all follow-up visits as told by your health care provider. This is important.  Questions to ask your health care provider   Would I benefit from therapy?   How often should I follow up with a health care provider?   How long do I need to take medicine?   Are there any long-term side effects of my medicine?   Are there any alternatives to taking medicine?  Contact a health care provider if:   You have a hard time staying focused or finishing daily tasks.   You spend many hours a day feeling worried about everyday life.   You become exhausted by worry.   You start to have headaches, feel tense, or have nausea.   You urinate more than normal.   You have diarrhea.  Get help right away if:   You have a racing heart and shortness of breath.   You have thoughts of hurting yourself or others.  If you ever feel like you may hurt yourself or others, or have thoughts about taking your own life, get help right away. You can go to your nearest emergency department or call:   Your local emergency  services (911 in the [place]   A suicide crisis helpline, such as the National Suicide Prevention Lifeline at 1-800-273-8255. This is open 24-hours a day.    Summary   Taking steps to deal with stress can help calm you.   Medicines cannot cure anxiety disorders, but they can help ease symptoms.   Family, friends, and partners can play a big part in helping you recover from an anxiety disorder.  This information is not intended to replace advice given to you by your health care provider. Make sure you discuss any questions you have with your health care provider.  Document Released: 01/08/2016 Document Revised: 01/08/2016 Document Reviewed: 01/08/2016  Elsevier Interactive Patient Education  2018 [employer]    Insomnia  Insomnia is a sleep disorder that makes it difficult to fall asleep or to stay asleep. Insomnia can   cause tiredness (fatigue), low energy, difficulty concentrating, mood swings, and poor performance at work or school.  There are three different ways to classify insomnia:   Difficulty falling asleep.   Difficulty staying asleep.   Waking up too early in the morning.    Any type of insomnia can be long-term (chronic) or short-term (acute). Both are common. Short-term insomnia usually lasts for three months or less. Chronic insomnia occurs at least three times a week for longer than three months.  What are the causes?  Insomnia may be caused by another condition, situation, or substance, such as:   Anxiety.   Certain medicines.   Gastroesophageal reflux disease (GERD) or other gastrointestinal conditions.   Asthma or other breathing conditions.   Restless legs syndrome, sleep apnea, or other sleep disorders.   Chronic pain.   Menopause. This may include hot flashes.   Stroke.   Abuse of alcohol, tobacco, or illegal drugs.   Depression.   Caffeine.   Neurological disorders, such as Alzheimer disease.   An overactive thyroid (hyperthyroidism).    The cause of insomnia may not be  known.  What increases the risk?  Risk factors for insomnia include:   Gender. Women are more commonly affected than men.   Age. Insomnia is more common as you get older.   Stress. This may involve your professional or personal life.   Income. Insomnia is more common in people with lower income.   Lack of exercise.   Irregular work schedule or night shifts.   Traveling between different time zones.    What are the signs or symptoms?  If you have insomnia, trouble falling asleep or trouble staying asleep is the main symptom. This may lead to other symptoms, such as:   Feeling fatigued.   Feeling nervous about going to sleep.   Not feeling rested in the morning.   Having trouble concentrating.   Feeling irritable, anxious, or depressed.    How is this treated?  Treatment for insomnia depends on the cause. If your insomnia is caused by an underlying condition, treatment will focus on addressing the condition. Treatment may also include:   Medicines to help you sleep.   Counseling or therapy.   Lifestyle adjustments.    Follow these instructions at home:   Take medicines only as directed by your health care provider.   Keep regular sleeping and waking hours. Avoid naps.   Keep a sleep diary to help you and your health care provider figure out what could be causing your insomnia. Include:  ? When you sleep.  ? When you wake up during the night.  ? How well you sleep.  ? How rested you feel the next day.  ? Any side effects of medicines you are taking.  ? What you eat and drink.   Make your bedroom a comfortable place where it is easy to fall asleep:  ? Put up shades or special blackout curtains to block light from outside.  ? Use a white noise machine to block noise.  ? Keep the temperature cool.   Exercise regularly as directed by your health care provider. Avoid exercising right before bedtime.   Use relaxation techniques to manage stress. Ask your health care provider to suggest some techniques  that may work well for you. These may include:  ? Breathing exercises.  ? Routines to release muscle tension.  ? Visualizing peaceful scenes.   Cut back on alcohol, caffeinated beverages, and cigarettes, especially close to   bedtime. These can disrupt your sleep.   Do not overeat or eat spicy foods right before bedtime. This can lead to digestive discomfort that can make it hard for you to sleep.   Limit screen use before bedtime. This includes:  ? Watching TV.  ? Using your smartphone, tablet, and computer.   Stick to a routine. This can help you fall asleep faster. Try to do a quiet activity, brush your teeth, and go to bed at the same time each night.   Get out of bed if you are still awake after 15 minutes of trying to sleep. Keep the lights down, but try reading or doing a quiet activity. When you feel sleepy, go back to bed.   Make sure that you drive carefully. Avoid driving if you feel very sleepy.   Keep all follow-up appointments as directed by your health care provider. This is important.  Contact a health care provider if:   You are tired throughout the day or have trouble in your daily routine due to sleepiness.   You continue to have sleep problems or your sleep problems get worse.  Get help right away if:   You have serious thoughts about hurting yourself or someone else.  This information is not intended to replace advice given to you by your health care provider. Make sure you discuss any questions you have with your health care provider.  Document Released: 01/11/2000 Document Revised: 06/15/2015 Document Reviewed: 10/14/2013  Elsevier Interactive Patient Education  2018 [employer] Inc[employer]

## 2018-01-12 LAB — BMP8+EGFR
BUN / CREAT RATIO: 10 (ref 9–23)
BUN: 10 mg/dL (ref 6–20)
CHLORIDE: 106 mmol/L (ref 96–106)
CO2: 20 mmol/L (ref 20–29)
Calcium: 9 mg/dL (ref 8.7–10.2)
Creatinine, Ser: 0.96 mg/dL (ref 0.57–1.00)
GFR calc Af Amer: 91 mL/min/{1.73_m2} (ref >59)
GFR, EST NON AFRICAN AMERICAN: 79 mL/min/{1.73_m2} (ref >59)
Glucose: 95 mg/dL (ref 65–99)
Potassium: 4.6 mmol/L (ref 3.5–5.2)
Sodium: 142 mmol/L (ref 134–144)

## 2018-01-12 LAB — LIPID PANEL
CHOL/HDL RATIO: 2.3 ratio (ref 0.0–4.4)
Cholesterol, Total: 123 mg/dL (ref 100–199)
HDL: 53 mg/dL (ref >39)
LDL Calculated: 48 mg/dL (ref 0–99)
Triglycerides: 109 mg/dL (ref 0–149)
VLDL CHOLESTEROL CAL: 22 mg/dL (ref 5–40)

## 2018-01-12 LAB — VITAMIN D 25 HYDROXY (VIT D DEFICIENCY, FRACTURES): VIT D 25 HYDROXY: 35.1 ng/mL (ref 30.0–100.0)

## 2018-01-25 ENCOUNTER — Encounter: Payer: Self-pay | Admitting: Nurse Practitioner

## 2018-01-25 ENCOUNTER — Ambulatory Visit: Payer: BC Managed Care – PPO | Admitting: Nurse Practitioner

## 2018-01-25 VITALS — BP 118/70 | HR 68 | Temp 98.3°F | Ht 62.4 in | Wt 256.4 lb

## 2018-01-25 DIAGNOSIS — G4700 Insomnia, unspecified: Secondary | ICD-10-CM | POA: Diagnosis not present

## 2018-01-25 DIAGNOSIS — F419 Anxiety disorder, unspecified: Secondary | ICD-10-CM

## 2018-01-25 NOTE — Progress Notes (Signed)
Subjective:     Patient ID: Belinda Barton , female    DOB: 26-May-1985 , 32 y.o.   MRN: [id number]   Chief Complaint  Patient presents with  . Anxiety    F/U on medication ; states she feels less anxious but feels loopy     HPI  Anxiety  Presents for follow-up visit. Patient reports no chest pain, depressed mood, dizziness, nervous/anxious behavior or palpitations. Primary symptoms comment: she feels "loopy". Symptoms occur constantly. The severity of symptoms is mild. The quality of sleep is good. Nighttime awakenings: none.       Past Medical History:  Diagnosis Date  . Anxiety   . Asthma   . Herpes genitalis in women   . Vitamin D deficiency      Family History  Problem Relation Age of Onset  . Diabetes Mother   . Asthma Brother      Current Outpatient Medications:  .  albuterol (PROVENTIL HFA;VENTOLIN HFA) 108 (90 Base) MCG/ACT inhaler, Inhale 2 puffs into the lungs every 6 (six) hours as needed for wheezing or shortness of breath., Disp: 1 Inhaler, Rfl: 1 .  Albuterol Sulfate (PROAIR RESPICLICK) 108 (90 Base) MCG/ACT AEPB, Inhale 2 puffs into the lungs every 6 (six) hours as needed., Disp: 1 each, Rfl: 1 .  beclomethasone (QVAR REDIHALER) 80 MCG/ACT inhaler, Inhale 2 puffs into the lungs 2 (two) times daily., Disp: 1 Inhaler, Rfl: 5 .  busPIRone (BUSPAR) 10 MG tablet, Take 0.5 tablets (5 mg total) by mouth daily., Disp: 30 tablet, Rfl: 1 .  ipratropium (ATROVENT) 0.06 % nasal spray, Place 2 sprays into both nostrils 4 (four) times daily., Disp: 15 mL, Rfl: 5 .  JUNEL FE 1/20 1-20 MG-MCG tablet, TAKE 1 TABLET BY MOUTH EVERY DAY FOR 21 DAYS, THEN DO NOT TAKE PLACEBO PILLS, TABLET-FREE FOR 7 DAYS, Disp: , Rfl: 3 .  levocetirizine (XYZAL) 5 MG tablet, Take 1 tablet (5 mg total) by mouth every evening., Disp: 30 tablet, Rfl: 5 .  montelukast (SINGULAIR) 10 MG tablet, TAKE 1 TABLET BY MOUTH EVERYDAY AT BEDTIME, Disp: 90 tablet, Rfl: 1 .  triamcinolone (NASACORT) 55 MCG/ACT  AERO nasal inhaler, Place 2 sprays into the nose daily., Disp: 1 Bottle, Rfl: 5 .  valACYclovir (VALTREX) 1000 MG tablet, Take 1,000 mg by mouth as needed. , Disp: , Rfl:  .  Vitamin D, Ergocalciferol, (DRISDOL) 50000 units CAPS capsule, , Disp: , Rfl:    Allergies  Allergen Reactions  . Cheese Shortness Of Breath    Self diagnosed  . Penicillins      Review of Systems  Constitutional: Negative.  Negative for fatigue.  HENT: Negative.   Respiratory: Negative.        Reports intermittent wheezing  Cardiovascular: Negative for chest pain and palpitations.  Neurological: Negative for dizziness and headaches.  Psychiatric/Behavioral: The patient is not nervous/anxious.      Today's Vitals   01/25/18 1038  BP: 118/70  Pulse: 68  Temp: 98.3 F (36.8 C)  TempSrc: Oral  SpO2: 97%  Weight: 256 lb 6.4 oz (116.3 kg)  Height: 5' 2.4" (1.585 m)   Body mass index is 46.3 kg/m.   Objective:  Physical Exam Vitals signs reviewed.  Constitutional:      Appearance: Normal appearance.  HENT:     Head: Normocephalic.  Cardiovascular:     Heart sounds: No murmur.  Pulmonary:     Effort: Pulmonary effort is normal.  Neurological:     General: No  focal deficit present.     Mental Status: She is alert and oriented to person, place, and time.     Cranial Nerves: No cranial nerve deficit.  Psychiatric:        Mood and Affect: Mood normal.        Behavior: Behavior normal.        Thought Content: Thought content normal.        Judgment: Judgment normal.         Assessment And Plan:   1. Anxiety  Chronic, feels this has improved and not as bad since starting the buspirone  Advised her to take 1/2 tablet daily to avoid feeling "groggy".    She will return to work on 02/01/17   She is still awaiting a phone call from Marisue Brooklynmy Stevenson for evaluation of concentration problems.  2. Insomnia, unspecified type  Chronic  Better since not working  No additional changes       Arnette FeltsJanece Moore, FNP

## 2018-01-25 NOTE — Patient Instructions (Signed)
   You can take 1/2 tab of the buspirone daily

## 2018-02-07 ENCOUNTER — Other Ambulatory Visit: Payer: Self-pay | Admitting: Allergy

## 2018-04-28 ENCOUNTER — Encounter: Payer: Self-pay | Admitting: Nurse Practitioner

## 2018-04-28 ENCOUNTER — Other Ambulatory Visit: Payer: Self-pay

## 2018-04-28 ENCOUNTER — Ambulatory Visit: Payer: BC Managed Care – PPO | Admitting: Nurse Practitioner

## 2018-04-28 VITALS — BP 130/76 | HR 80 | Temp 97.9°F | Ht 62.6 in | Wt 255.8 lb

## 2018-04-28 DIAGNOSIS — R41840 Attention and concentration deficit: Secondary | ICD-10-CM

## 2018-04-28 DIAGNOSIS — F419 Anxiety disorder, unspecified: Secondary | ICD-10-CM

## 2018-04-28 DIAGNOSIS — G4700 Insomnia, unspecified: Secondary | ICD-10-CM

## 2018-04-28 DIAGNOSIS — H1010 Acute atopic conjunctivitis, unspecified eye: Secondary | ICD-10-CM

## 2018-04-28 DIAGNOSIS — J309 Allergic rhinitis, unspecified: Secondary | ICD-10-CM

## 2018-04-28 DIAGNOSIS — R062 Wheezing: Secondary | ICD-10-CM

## 2018-04-28 MED ORDER — LEVOCETIRIZINE DIHYDROCHLORIDE 5 MG PO TABS: 5.0000 mg | ORAL_TABLET | Freq: Every evening | ORAL | 5 refills | Status: DC

## 2018-04-28 MED ORDER — ALBUTEROL SULFATE HFA 108 (90 BASE) MCG/ACT IN AERS: 2.0000 | INHALATION_SPRAY | Freq: Four times a day (QID) | RESPIRATORY_TRACT | 1 refills | Status: DC | PRN

## 2018-04-28 MED ORDER — BECLOMETHASONE DIPROP HFA 80 MCG/ACT IN AERB: 2.0000 | INHALATION_SPRAY | Freq: Two times a day (BID) | RESPIRATORY_TRACT | 5 refills | Status: DC

## 2018-04-28 MED ORDER — IPRATROPIUM BROMIDE 0.06 % NA SOLN: 2.0000 | Freq: Four times a day (QID) | NASAL | 5 refills | Status: DC

## 2018-04-28 NOTE — Progress Notes (Signed)
Subjective:     Patient ID: Belinda Barton , female    DOB: Feb 21, 1985 , 33 y.o.   MRN: [id number]   Chief Complaint  Patient presents with  . Anxiety    buspurion    HPI  She has been to Dr. Elisabeth Most - recommends her taking 1/2 tab of buspar at bedtime.  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 Patient reports no chest pain, decreased concentration, dizziness, insomnia, irritability or palpitations. The severity of symptoms is mild (she is doing well with the medication).       Past Medical History:  Diagnosis Date  . Anxiety   . Asthma   . Herpes genitalis in women   . Vitamin D deficiency      Family History  Problem Relation Age of Onset  . Diabetes Mother   . Asthma Brother      Current Outpatient Medications:  .  albuterol (PROVENTIL HFA;VENTOLIN HFA) 108 (90 Base) MCG/ACT inhaler, Inhale 2 puffs into the lungs every 6 (six) hours as needed for wheezing or shortness of breath., Disp: 1 Inhaler, Rfl: 1 .  beclomethasone (QVAR REDIHALER) 80 MCG/ACT inhaler, Inhale 2 puffs into the lungs 2 (two) times daily., Disp: 1 Inhaler, Rfl: 5 .  busPIRone (BUSPAR) 10 MG tablet, Take 0.5 tablets (5 mg total) by mouth daily., Disp: 30 tablet, Rfl: 1 .  ipratropium (ATROVENT) 0.06 % nasal spray, Place 2 sprays into both nostrils 4 (four) times daily., Disp: 15 mL, Rfl: 5 .  JUNEL FE 1/20 1-20 MG-MCG tablet, TAKE 1 TABLET BY MOUTH EVERY DAY FOR 21 DAYS, THEN DO NOT TAKE PLACEBO PILLS, TABLET-FREE FOR 7 DAYS, Disp: , Rfl: 3 .  levocetirizine (XYZAL) 5 MG tablet, Take 1 tablet (5 mg total) by mouth every evening., Disp: 30 tablet, Rfl: 5 .  lisdexamfetamine (VYVANSE) 20 MG capsule, Take 20 mg by mouth daily., Disp: , Rfl:  .  montelukast (SINGULAIR) 10 MG tablet, TAKE 1 TABLET BY MOUTH EVERYDAY AT BEDTIME, Disp: 90 tablet, Rfl: 1 .  triamcinolone  (NASACORT) 55 MCG/ACT AERO nasal inhaler, Place 2 sprays into the nose daily., Disp: 1 Bottle, Rfl: 5 .  valACYclovir (VALTREX) 1000 MG tablet, Take 1,000 mg by mouth as needed. , Disp: , Rfl:  .  Vitamin D, Ergocalciferol, (DRISDOL) 50000 units CAPS capsule, , Disp: , Rfl:  .  Albuterol Sulfate (PROAIR RESPICLICK) 108 (90 Base) MCG/ACT AEPB, Inhale 2 puffs into the lungs every 6 (six) hours as needed. (Patient not taking: Reported on 04/28/2018), Disp: 1 each, Rfl: 1   Allergies  Allergen Reactions  . Cheese Shortness Of Breath    Self diagnosed  . Penicillins      Review of Systems  Constitutional: Negative for fatigue and irritability.  Respiratory: Negative for cough.   Cardiovascular: Negative for chest pain, palpitations and leg swelling.  Neurological: Negative for dizziness and headaches.  Psychiatric/Behavioral: Negative for decreased concentration. The patient does not have insomnia.      Today's Vitals   04/28/18 0906  BP: 130/76  Pulse: 80  Temp: 97.9 F (36.6 C)  TempSrc: Oral  SpO2: 98%  Weight: 255 lb 12.8 oz (116 kg)  Height: 5' 2.6" (1.59 m)   Body mass index is 45.89 kg/m.   Objective:  Physical Exam Constitutional:      Appearance: Normal appearance.  Cardiovascular:     Rate and Rhythm: Normal rate and regular rhythm.     Pulses: Normal pulses.     Heart sounds: Normal heart sounds. No murmur.  Pulmonary:     Effort: Pulmonary effort is normal. No respiratory distress.     Breath sounds: Normal breath sounds.  Skin:    General: Skin is warm and dry.     Capillary Refill: Capillary refill takes less than 2 seconds.  Neurological:     General: No focal deficit present.     Mental Status: She is alert and oriented to person, place, and time.  Psychiatric:        Mood and Affect: Mood normal.        Behavior: Behavior normal.        Thought Content: Thought content normal.        Judgment: Judgment normal.         Assessment And Plan:     1.  Anxiety  She is doing better with the Buspar, she was advised to take the other half of the buspar at bedtime by Dr Elisabeth Most  2. Poor concentration  Being followed by Dr. Elisabeth Most, who now has her on Vyvanse 20mg  daily she feels has been helpful.    3. Insomnia, unspecified type  She is now taking her buspirone at bedtime to help  4. Allergic rhinoconjunctivitis  With the high pollen counts she is having an increase in her rhinitis - levocetirizine (XYZAL) 5 MG tablet; Take 1 tablet (5 mg total) by mouth every evening.  Dispense: 30 tablet; Refill: 5 - ipratropium (ATROVENT) 0.06 % nasal spray; Place 2 sprays into both nostrils 4 (four) times daily.  Dispense: 15 mL; Refill: 5  5. Wheezing  No wheezing noted on physical exam.  However will refill albuterol and atrovent nasal spray - albuterol (PROVENTIL HFA;VENTOLIN HFA) 108 (90 Base) MCG/ACT inhaler; Inhale 2 puffs into the lungs every 6 (six) hours as needed for wheezing or shortness of breath.  Dispense: 1 Inhaler; Refill: 1 - beclomethasone (QVAR REDIHALER) 80 MCG/ACT inhaler; Inhale 2 puffs into the lungs 2 (two) times daily.  Dispense: 1 Inhaler; Refill: 5  Arnette Felts, FNP

## 2018-05-13 ENCOUNTER — Other Ambulatory Visit: Payer: Self-pay | Admitting: Allergy

## 2018-05-13 DIAGNOSIS — J309 Allergic rhinitis, unspecified: Principal | ICD-10-CM

## 2018-05-13 DIAGNOSIS — H1010 Acute atopic conjunctivitis, unspecified eye: Secondary | ICD-10-CM

## 2018-05-13 MED ORDER — TRIAMCINOLONE ACETONIDE 55 MCG/ACT NA AERO: 2.0000 | INHALATION_SPRAY | Freq: Every day | NASAL | 2 refills | Status: DC

## 2018-05-13 MED ORDER — LORATADINE 10 MG PO TABS: 10.0000 mg | ORAL_TABLET | Freq: Every day | ORAL | 0 refills | Status: DC

## 2018-05-13 NOTE — Telephone Encounter (Signed)
Medications sent

## 2018-05-13 NOTE — Telephone Encounter (Signed)
Sorry, no Montelukast. Just, Claritin and she is requesting Nasacort. [facility] on [address].

## 2018-05-13 NOTE — Telephone Encounter (Signed)
Called pt to change visit to virtual and she would prefer to come into the office. So I pushed the appt out a month, therefore, she needs a refill for montelukast and is requesting a prescription for Claritin. [facility] on [address].

## 2018-05-21 ENCOUNTER — Ambulatory Visit: Payer: BC Managed Care – PPO | Admitting: Allergy

## 2018-06-17 ENCOUNTER — Ambulatory Visit: Payer: BC Managed Care – PPO | Admitting: Allergy

## 2018-06-17 ENCOUNTER — Encounter: Payer: Self-pay | Admitting: Allergy

## 2018-06-17 ENCOUNTER — Other Ambulatory Visit: Payer: Self-pay

## 2018-06-17 VITALS — BP 118/76 | HR 72 | Temp 98.2°F | Resp 16

## 2018-06-17 DIAGNOSIS — T781XXD Other adverse food reactions, not elsewhere classified, subsequent encounter: Secondary | ICD-10-CM

## 2018-06-17 DIAGNOSIS — J45990 Exercise induced bronchospasm: Secondary | ICD-10-CM | POA: Diagnosis not present

## 2018-06-17 DIAGNOSIS — J3089 Other allergic rhinitis: Secondary | ICD-10-CM | POA: Diagnosis not present

## 2018-06-17 DIAGNOSIS — J310 Chronic rhinitis: Secondary | ICD-10-CM

## 2018-06-17 MED ORDER — TRIAMCINOLONE ACETONIDE 55 MCG/ACT NA AERO: 2.0000 | INHALATION_SPRAY | Freq: Every day | NASAL | 5 refills | Status: DC

## 2018-06-17 MED ORDER — BECLOMETHASONE DIPROP HFA 80 MCG/ACT IN AERB: 2.0000 | INHALATION_SPRAY | Freq: Two times a day (BID) | RESPIRATORY_TRACT | 5 refills | Status: DC

## 2018-06-17 MED ORDER — IPRATROPIUM BROMIDE 0.06 % NA SOLN: 2.0000 | Freq: Four times a day (QID) | NASAL | 5 refills | Status: DC

## 2018-06-17 MED ORDER — MONTELUKAST SODIUM 10 MG PO TABS: ORAL_TABLET | ORAL | 1 refills | Status: DC

## 2018-06-17 MED ORDER — LEVOCETIRIZINE DIHYDROCHLORIDE 5 MG PO TABS: 5.0000 mg | ORAL_TABLET | Freq: Every evening | ORAL | 5 refills | Status: DC

## 2018-06-17 MED ORDER — ALBUTEROL SULFATE HFA 108 (90 BASE) MCG/ACT IN AERS: 2.0000 | INHALATION_SPRAY | Freq: Four times a day (QID) | RESPIRATORY_TRACT | 1 refills | Status: DC | PRN

## 2018-06-17 MED ORDER — EPINEPHRINE 0.3 MG/0.3ML IJ SOAJ: 0.3000 mg | Freq: Once | INTRAMUSCULAR | 2 refills | Status: AC

## 2018-06-17 NOTE — Progress Notes (Signed)
Follow-up Note  RE: Belinda Barton MRN: [id number] DOB: 12-Jul-1985 Date of Office Visit: 06/17/2018   History of present illness: Belinda Barton is a 33 y.o. female presenting today for follow-up of adverse food reaction with gustatory rhinitis, exercise-induced asthma and allergic rhinitis.  She was last seen in the office on 11/20/17 by myself.  She states she has not had any major health changes, surgeries or hospitalizations since her last visit.  She states that she still develops nasal drainage when she eats cheeses but does state that the Atrovent works when she remembers to use it. She states that she has been inconsistent with using the Qvar to help with her exercise-induced asthma.  She states that she has not been that active with the stay at home order.  She also has not been in the school where she feels like she may have some environmental exposures there.  She states she will use the Qvar when she is having more symptoms and needing to use her albuterol more.  She reports about 1-2 uses of albuterol per month for cough or wheeze symptoms.  She denies any nighttime awakenings.  No ED or urgent care visits or systemic steroid needs. She also states she has not had any major allergies and has not needed to use her Xyzal much at all.  She does state that she has been using a humidifier at home.  She does have a dust mite sensitivity.  Review of systems: Review of Systems  Constitutional: Negative for chills, fever and malaise/fatigue.  HENT: Negative for congestion, ear discharge, nosebleeds and sore throat.   Eyes: Negative for pain, discharge and redness.  Respiratory: Positive for cough and wheezing.   Cardiovascular: Negative for chest pain.  Gastrointestinal: Negative for abdominal pain, constipation, diarrhea, heartburn, nausea and vomiting.  Musculoskeletal: Negative for joint pain.  Skin: Negative for itching and rash.  Neurological: Negative for headaches.    All  other systems negative unless noted above in HPI  Past medical/social/surgical/family history have been reviewed and are unchanged unless specifically indicated below.  No changes  Medication List: Allergies as of 06/17/2018      Reactions   Cheese Shortness Of Breath   Self diagnosed   Penicillins       Medication List       Accurate as of Jun 17, 2018  6:02 PM. If you have any questions, ask your nurse or doctor.        albuterol 108 (90 Base) MCG/ACT inhaler Commonly known as:  VENTOLIN HFA Inhale 2 puffs into the lungs every 6 (six) hours as needed for wheezing or shortness of breath.   beclomethasone 80 MCG/ACT inhaler Commonly known as:  Qvar RediHaler Inhale 2 puffs into the lungs 2 (two) times daily.   busPIRone 10 MG tablet Commonly known as:  BUSPAR Take 0.5 tablets (5 mg total) by mouth daily.   EPINEPHrine 0.3 mg/0.3 mL Soaj injection Commonly known as:  EPI-PEN Inject 0.3 mLs (0.3 mg total) into the muscle once for 1 dose. Started by:  Jailyn Leeson Larose Hires, MD   ipratropium 0.06 % nasal spray Commonly known as:  Atrovent Place 2 sprays into both nostrils 4 (four) times daily.   Junel FE 1/20 1-20 MG-MCG tablet Generic drug:  norethindrone-ethinyl estradiol TAKE 1 TABLET BY MOUTH EVERY DAY FOR 21 DAYS, THEN DO NOT TAKE PLACEBO PILLS, TABLET-FREE FOR 7 DAYS   levocetirizine 5 MG tablet Commonly known as:  XYZAL Take 1 tablet (5  mg total) by mouth every evening.   lisdexamfetamine 20 MG capsule Commonly known as:  VYVANSE Take 20 mg by mouth daily.   loratadine 10 MG tablet Commonly known as:  CLARITIN Take 1 tablet (10 mg total) by mouth daily.   montelukast 10 MG tablet Commonly known as:  SINGULAIR TAKE 1 TABLET BY MOUTH EVERYDAY AT BEDTIME   triamcinolone 55 MCG/ACT Aero nasal inhaler Commonly known as:  NASACORT Place 2 sprays into the nose daily.   valACYclovir 1000 MG tablet Commonly known as:  VALTREX Take 1,000 mg by mouth as  needed.   Vitamin D (Ergocalciferol) 1.25 MG (50000 UT) Caps capsule Commonly known as:  DRISDOL       Known medication allergies: Allergies  Allergen Reactions  . Cheese Shortness Of Breath    Self diagnosed  . Penicillins      Physical examination: Blood pressure 118/76, pulse 72, temperature 98.2 F (36.8 C), temperature source Oral, resp. rate 16, SpO2 98 %.  General: Alert, interactive, in no acute distress. HEENT: PERRLA, TMs pearly gray, turbinates minimally edematous with clear discharge, post-pharynx non erythematous. Neck: Supple without lymphadenopathy. Lungs: Clear to auscultation without wheezing, rhonchi or rales. {no increased work of breathing. CV: Normal S1, S2 without murmurs. Abdomen: Nondistended, nontender. Skin: Warm and dry, without lesions or rashes. Extremities:  No clubbing, cyanosis or edema. Neuro:   Grossly intact.  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    Your milk serum IgE and cheese (moldy type) IgE were negative on previous blood testing and previous skin testing to milk is negative.  Thus you do not have IgE mediated food allergy to dairy.     - skin testing previously for tomato and shrimp were negative but you did have serum IgE detectable levels to both tomato and shrimp thus this could represent IgE mediated food allergy.  Would continue to avoid these foods and have access to your Epipen.      -use nasal Atrovent 2 sprays as needed up to 3-4 times a day if your going to eat cheese or other foods or situations where you develop nasal congestion and drainage.  You can use prior to eating cheese to help decrease nasal drainage or after you eat cheeses to help dry up the nasal drainage.    Exercise-induced asthma/Reactive airway disease   - continue Singulair 10mg  daily-- take at bedtime   - use  Qvar 2 puffs twice a day if not meeting the below goals   - have access to albuterol inhaler 2 puffs every 4-6 hours as needed for cough/wheeze/shortness of breath/chest tightness.  May use 15-20 minutes prior to activity.   Monitor frequency of use.   Asthma control goals:   Full participation in all desired activities (may need albuterol before activity)  Albuterol use two time or less a week on average (not counting use with activity)  Cough interfering with sleep two time or less a month  Oral steroids no more than once a year  No hospitalizations  Allergic rhinitis    - continue  Xyzal 5mg  daily    - for nasal congestion and drainage recommend use of Nasacort 2 sprays each nostril daily    - continue allergen avoidance grasses, weeds, trees, mold (botrytis cinera), dust mites, dog, cockroach.  Would not recommend use of humidifier as this releases moisture into the  air and dust mites live/thrive on moisture and will be more rampant in your environment.  Would recommend an air purifier with HEPA filtration instead.       Follow-up 6 months or sooner if needed   I appreciate the opportunity to take part in Belinda Barton's care. Please do not hesitate to contact me with questions.  Sincerely,   Margo AyeShaylar Raza Bayless, MD Allergy/Immunology [facility]

## 2018-06-17 NOTE — Patient Instructions (Addendum)
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    Your milk serum IgE and cheese (moldy type) IgE were negative on previous blood testing and previous skin testing to milk is negative.  Thus you do not have IgE mediated food allergy to dairy.     - skin testing previously for tomato and shrimp were negative but you did have serum IgE detectable levels to both tomato and shrimp thus this could represent IgE mediated food allergy.  Would continue to avoid these foods and have access to your Epipen.      -use nasal Atrovent 2 sprays as needed up to 3-4 times a day if your going to eat cheese or other foods or situations where you develop nasal congestion and drainage.  You can use prior to eating cheese to help decrease nasal drainage or after you eat cheeses to help dry up the nasal drainage.    Exercise-induced asthma/Reactive airway disease   - continue Singulair 10mg  daily-- take at bedtime   - use Qvar 2 puffs twice a day if not meeting the below goals   - have access to albuterol inhaler 2 puffs every 4-6 hours as needed for cough/wheeze/shortness of breath/chest tightness.  May use 15-20 minutes prior to activity.   Monitor frequency of use.   Asthma control goals:   Full participation in all desired activities (may need albuterol before activity)  Albuterol use two time or less a week on average (not counting use with activity)  Cough interfering with sleep two time or less a month  Oral steroids no more than once a year  No hospitalizations  Allergic rhinitis    - continue  Xyzal 5mg  daily    - for nasal congestion and drainage recommend use of Nasacort 2 sprays each nostril daily    - continue allergen avoidance grasses, weeds, trees, mold (botrytis cinera), dust mites, dog, cockroach.  Would not recommend use of humidifier as this releases moisture into the air and dust mites live/thrive on moisture and will be more  rampant in your environment.  Would recommend an air purifier with HEPA filtration instead.       Follow-up 6 months or sooner if needed

## 2018-06-23 ENCOUNTER — Other Ambulatory Visit: Payer: Self-pay | Admitting: Nurse Practitioner

## 2018-06-23 DIAGNOSIS — F419 Anxiety disorder, unspecified: Secondary | ICD-10-CM

## 2018-10-25 ENCOUNTER — Other Ambulatory Visit: Payer: Self-pay | Admitting: Nurse Practitioner

## 2018-10-25 DIAGNOSIS — F419 Anxiety disorder, unspecified: Secondary | ICD-10-CM

## 2018-11-01 ENCOUNTER — Encounter: Payer: BC Managed Care – PPO | Admitting: Nurse Practitioner

## 2018-11-11 ENCOUNTER — Telehealth: Payer: Self-pay | Admitting: Allergy

## 2018-11-11 NOTE — Telephone Encounter (Signed)
Spoke to patient and let her know she should have 5 refills in the pharmacy for Qvar as well as Nasacort. She will no longer need any samples for Qvar.

## 2018-11-11 NOTE — Telephone Encounter (Signed)
Pt called to see if we had any samples of Qvar if not to call it into [facility] on [address]. [phone]

## 2018-11-12 ENCOUNTER — Ambulatory Visit: Payer: BC Managed Care – PPO | Admitting: Allergy

## 2018-11-12 ENCOUNTER — Other Ambulatory Visit: Payer: Self-pay

## 2018-11-12 MED ORDER — LORATADINE 10 MG PO TABS: 10.0000 mg | ORAL_TABLET | Freq: Every day | ORAL | 1 refills | Status: DC

## 2018-12-22 ENCOUNTER — Ambulatory Visit: Payer: BC Managed Care – PPO | Admitting: Allergy

## 2018-12-22 ENCOUNTER — Other Ambulatory Visit: Payer: Self-pay

## 2018-12-22 ENCOUNTER — Encounter: Payer: Self-pay | Admitting: Allergy

## 2018-12-22 VITALS — BP 142/100 | HR 86 | Temp 97.8°F | Resp 16 | Ht 63.78 in | Wt 245.6 lb

## 2018-12-22 DIAGNOSIS — J45990 Exercise induced bronchospasm: Secondary | ICD-10-CM | POA: Diagnosis not present

## 2018-12-22 DIAGNOSIS — J3089 Other allergic rhinitis: Secondary | ICD-10-CM | POA: Diagnosis not present

## 2018-12-22 DIAGNOSIS — T781XXD Other adverse food reactions, not elsewhere classified, subsequent encounter: Secondary | ICD-10-CM | POA: Diagnosis not present

## 2018-12-22 DIAGNOSIS — J310 Chronic rhinitis: Secondary | ICD-10-CM | POA: Diagnosis not present

## 2018-12-22 MED ORDER — FAMOTIDINE 20 MG PO TABS: 20.0000 mg | ORAL_TABLET | Freq: Two times a day (BID) | ORAL | 5 refills | Status: DC

## 2018-12-22 NOTE — Progress Notes (Signed)
Follow-up Note  RE: Belinda Barton MRN: [id number] DOB: [birth year] Date of Office Visit: 12/22/2018   History of present illness: Belinda Barton is a 32 y.o. female presenting today for follow-up of gustatory rhinitis and adverse food reaction, exercise induced asthma and allergic rhinitis.  She was last seen in the office on 06/17/2018.    She states she has been having GI issues where she feels abdominal pain, bloating and gassiness after eating.  She has not identified any specific foods but is concerned about milk.  Milk has been a culprit food for her with her gustatory rhinitis.  She has atrovent to use prior to known dairy ingestion to prevent increase in congestion.  She states with taking her Qvar and Singulair consistently that her asthma is under good control.  She states she can tell if she misses a dose.  No ED/UC visits or systemic steroid needs.    She is taking xyzal daily and also has nasal steroid to use for any nasal congestion.   She feels her home has mold and is looking into getting a air purifier.    Review of systems: Review of Systems  Constitutional: Negative for chills, fever and malaise/fatigue.  HENT: Positive for congestion. Negative for ear discharge, ear pain, nosebleeds, sinus pain and sore throat.   Eyes: Negative for pain, discharge and redness.  Respiratory: Negative.   Cardiovascular: Negative.   Gastrointestinal: Positive for abdominal pain.  Musculoskeletal: Negative.   Skin: Negative for itching and rash.  Neurological: Negative.     All other systems negative unless noted above in HPI  Past medical/social/surgical/family history have been reviewed and are unchanged unless specifically indicated below.  No changes  Medication List: Current Outpatient Medications  Medication Sig Dispense Refill  . albuterol (VENTOLIN HFA) 108 (90 Base) MCG/ACT inhaler Inhale 2 puffs into the lungs every 6 (six) hours as needed for wheezing or shortness of  breath. 1 Inhaler 1  . beclomethasone (QVAR REDIHALER) 80 MCG/ACT inhaler Inhale 2 puffs into the lungs 2 (two) times daily. 1 Inhaler 5  . busPIRone (BUSPAR) 10 MG tablet TAKE 1/2 TABLET(5 MG) BY MOUTH DAILY 30 tablet 1  . ipratropium (ATROVENT) 0.06 % nasal spray Place 2 sprays into both nostrils 4 (four) times daily. 15 mL 5  . levocetirizine (XYZAL) 5 MG tablet Take 1 tablet (5 mg total) by mouth every evening. 30 tablet 5  . lisdexamfetamine (VYVANSE) 20 MG capsule Take 20 mg by mouth daily.    Marland Kitchen loratadine (CLARITIN) 10 MG tablet Take 1 tablet (10 mg total) by mouth daily. 30 tablet 1  . montelukast (SINGULAIR) 10 MG tablet TAKE 1 TABLET BY MOUTH EVERYDAY AT BEDTIME 90 tablet 1  . triamcinolone (NASACORT) 55 MCG/ACT AERO nasal inhaler Place 2 sprays into the nose daily. 1 Bottle 5  . valACYclovir (VALTREX) 1000 MG tablet Take 1,000 mg by mouth as needed.     . Vitamin D, Ergocalciferol, (DRISDOL) 50000 units CAPS capsule     . famotidine (PEPCID) 20 MG tablet Take 1 tablet (20 mg total) by mouth 2 (two) times daily. 60 tablet 5  . JUNEL FE 1/20 1-20 MG-MCG tablet TAKE 1 TABLET BY MOUTH EVERY DAY FOR 21 DAYS, THEN DO NOT TAKE PLACEBO PILLS, TABLET-FREE FOR 7 DAYS  3   No current facility-administered medications for this visit.      Known medication allergies: Allergies  Allergen Reactions  . Cheese Shortness Of Breath    Self diagnosed  .  Penicillins      Physical examination: Blood pressure (!) 142/100, pulse 86, temperature 97.8 F (36.6 C), temperature source Temporal, resp. rate 16, height 5' 3.78" (1.62 m), weight 245 lb 9.6 oz (111.4 kg), SpO2 99 %.  General: Alert, interactive, in no acute distress. HEENT: PERRLA, TMs pearly gray, turbinates moderately edematous without discharge, post-pharynx non erythematous. Neck: Supple without lymphadenopathy. Lungs: Clear to auscultation without wheezing, rhonchi or rales. {no increased work of breathing. CV: Normal S1, S2 without  murmurs. Abdomen: Nondistended, nontender. Skin: Warm and dry, without lesions or rashes. Extremities:  No clubbing, cyanosis or edema. Neuro:   Grossly intact.  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    Your milk serum IgE and cheese (moldy type) IgE were negative on previous blood testing and previous skin testing to milk was negative.     - skin testing previously for tomato and shrimp were negative but you did have serum IgE detectable levels to both tomato and shrimp thus this could represent IgE mediated food allergy.  Would continue to avoid these foods and have access to your Epipen.      - will obtain complete basic food allergen panel with increased in GI symptoms following food ingestion to determine if you have any more sensitivities other than tomato and shrimp.      - recommend trial of Pepcid 20mg  twice a day to see if this will help prevent and management GI symptoms with ingestion    -use nasal Atrovent 2 sprays as needed up to 3-4 times a day if your going to eat cheese or other foods or situations where you develop nasal congestion and drainage.  You can use prior to eating cheese to help decrease nasal drainage or after you eat cheeses to help dry up the nasal drainage.    Exercise-induced asthma/Reactive airway disease   - continue Singulair 10mg  daily-- take at bedtime   - use Qvar 2 puffs twice a day if not meeting the below goals   - have access to albuterol inhaler 2 puffs every 4-6 hours as needed for cough/wheeze/shortness of breath/chest tightness.  May use 15-20 minutes prior to activity.   Monitor frequency of use.   Asthma control goals:   Full participation in all desired activities (may need albuterol before activity)  Albuterol use two time or less a week on average (not counting use with activity)  Cough interfering with  sleep two time or less a month  Oral steroids no more than once a year  No hospitalizations  Allergic rhinitis    - continue  Xyzal 5mg  daily    - for nasal congestion and drainage recommend use of Nasacort 2 sprays each nostril daily    - continue allergen avoidance grasses, weeds, trees, mold (botrytis cinera), dust mites, dog, cockroach.  Would not recommend use of humidifier as this releases moisture into the air and dust mites live/thrive on moisture and will be more rampant in your environment.  Would recommend an air purifier with HEPA filtration instead.       Follow-up 6 months or sooner if needed  I appreciate the opportunity to take part in Belinda Barton's care. Please do not hesitate to contact me with questions.  Sincerely,   , MD Allergy/Immunology [facility]

## 2018-12-22 NOTE — Patient Instructions (Addendum)
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    Your milk serum IgE and cheese (moldy type) IgE were negative on previous blood testing and previous skin testing to milk was negative.     - skin testing previously for tomato and shrimp were negative but you did have serum IgE detectable levels to both tomato and shrimp thus this could represent IgE mediated food allergy.  Would continue to avoid these foods and have access to your Epipen.      - will obtain complete basic food allergen panel with increased in GI symptoms following food ingestion to determine if you have any more sensitivities other than tomato and shrimp.      - recommend trial of Pepcid 20mg  twice a day to see if this will help prevent and management GI symptoms with ingestion    -use nasal Atrovent 2 sprays as needed up to 3-4 times a day if your going to eat cheese or other foods or situations where you develop nasal congestion and drainage.  You can use prior to eating cheese to help decrease nasal drainage or after you eat cheeses to help dry up the nasal drainage.    Exercise-induced asthma/Reactive airway disease   - continue Singulair 10mg  daily-- take at bedtime   - use Qvar 102mcg 2 puffs twice a day if not meeting the below goals   - have access to albuterol inhaler 2 puffs every 4-6 hours as needed for cough/wheeze/shortness of breath/chest tightness.  May use 15-20 minutes prior to activity.   Monitor frequency of use.   Asthma control goals:   Full participation in all desired activities (may need albuterol before activity)  Albuterol use two time or less a week on average (not counting use with activity)  Cough interfering with sleep two time or less a month  Oral steroids no more than once a year  No hospitalizations  Allergic rhinitis    - continue  Xyzal 5mg  daily    - for nasal congestion and drainage recommend use of Nasacort 2 sprays each  nostril daily    - continue allergen avoidance grasses, weeds, trees, mold (botrytis cinera), dust mites, dog, cockroach.  Would not recommend use of humidifier as this releases moisture into the air and dust mites live/thrive on moisture and will be more rampant in your environment.  Would recommend an air purifier with HEPA filtration instead.       Follow-up 6 months or sooner if needed

## 2019-02-08 ENCOUNTER — Encounter: Payer: Self-pay | Admitting: Nurse Practitioner

## 2019-02-08 ENCOUNTER — Ambulatory Visit (INDEPENDENT_AMBULATORY_CARE_PROVIDER_SITE_OTHER): Payer: BC Managed Care – PPO | Admitting: Nurse Practitioner

## 2019-02-08 ENCOUNTER — Other Ambulatory Visit: Payer: Self-pay

## 2019-02-08 VITALS — BP 120/84 | HR 69 | Temp 97.9°F | Ht 63.6 in | Wt 244.6 lb

## 2019-02-08 DIAGNOSIS — M436 Torticollis: Secondary | ICD-10-CM | POA: Diagnosis not present

## 2019-02-08 DIAGNOSIS — S161XXA Strain of muscle, fascia and tendon at neck level, initial encounter: Secondary | ICD-10-CM | POA: Diagnosis not present

## 2019-02-08 DIAGNOSIS — T148XXA Other injury of unspecified body region, initial encounter: Secondary | ICD-10-CM | POA: Insufficient documentation

## 2019-02-08 MED ORDER — TRIAMCINOLONE ACETONIDE 40 MG/ML IJ SUSP
40.0000 mg | Freq: Once | INTRAMUSCULAR | Status: AC
  Administered 2019-02-08: 16:00:00 40 mg via INTRAMUSCULAR

## 2019-02-08 MED ORDER — CYCLOBENZAPRINE HCL 10 MG PO TABS: 10.0000 mg | ORAL_TABLET | Freq: Three times a day (TID) | ORAL | 0 refills | Status: DC | PRN

## 2019-02-08 NOTE — Progress Notes (Signed)
This visit occurred during the SARS-CoV-2 public health emergency.  Safety protocols were in place, including screening questions prior to the visit, additional usage of staff PPE, and extensive cleaning of exam room while observing appropriate contact time as indicated for disinfecting solutions.  Subjective:     Patient ID: Belinda Barton , female    DOB: 08/22/1985 , 34 y.o.   MRN: [id number]   Chief Complaint  Patient presents with  . Neck Pain    patient has a throbbing pain that radiates down to her right shoulder. she stated she is unable to move her head without having pain. the pain started about 4-5 days ago    HPI  Neck Pain  This is a new problem. The current episode started in the past 7 days (4-5 days ago). The problem occurs constantly. The problem has been gradually worsening. The pain is associated with a sleep position. The quality of the pain is described as aching and stabbing. The pain is moderate. The pain is same all the time. Pertinent negatives include no chest pain, headaches, numbness, paresis, tingling or weakness. Associated symptoms comments: Radiating down to right shoulder. She has tried acetaminophen and NSAIDs (extra strength tylenol and 800mg  ibuprofen - 2-3 days) for the symptoms.     Past Medical History:  Diagnosis Date  . Anxiety   . Asthma   . Herpes genitalis in women   . Vitamin D deficiency      Family History  Problem Relation Age of Onset  . Diabetes Mother   . Asthma Brother      Current Outpatient Medications:  .  albuterol (VENTOLIN HFA) 108 (90 Base) MCG/ACT inhaler, Inhale 2 puffs into the lungs every 6 (six) hours as needed for wheezing or shortness of breath., Disp: 1 Inhaler, Rfl: 1 .  beclomethasone (QVAR REDIHALER) 80 MCG/ACT inhaler, Inhale 2 puffs into the lungs 2 (two) times daily., Disp: 1 Inhaler, Rfl: 5 .  busPIRone (BUSPAR) 10 MG tablet, TAKE 1/2 TABLET(5 MG) BY MOUTH DAILY, Disp: 30 tablet, Rfl: 1 .  famotidine  (PEPCID) 20 MG tablet, Take 1 tablet (20 mg total) by mouth 2 (two) times daily., Disp: 60 tablet, Rfl: 5 .  ipratropium (ATROVENT) 0.06 % nasal spray, Place 2 sprays into both nostrils 4 (four) times daily., Disp: 15 mL, Rfl: 5 .  levocetirizine (XYZAL) 5 MG tablet, Take 1 tablet (5 mg total) by mouth every evening., Disp: 30 tablet, Rfl: 5 .  lisdexamfetamine (VYVANSE) 20 MG capsule, Take 20 mg by mouth daily., Disp: , Rfl:  .  loratadine (CLARITIN) 10 MG tablet, Take 1 tablet (10 mg total) by mouth daily., Disp: 30 tablet, Rfl: 1 .  montelukast (SINGULAIR) 10 MG tablet, TAKE 1 TABLET BY MOUTH EVERYDAY AT BEDTIME, Disp: 90 tablet, Rfl: 1 .  triamcinolone (NASACORT) 55 MCG/ACT AERO nasal inhaler, Place 2 sprays into the nose daily., Disp: 1 Bottle, Rfl: 5 .  valACYclovir (VALTREX) 1000 MG tablet, Take 1,000 mg by mouth as needed. , Disp: , Rfl:  .  JUNEL FE 1/20 1-20 MG-MCG tablet, TAKE 1 TABLET BY MOUTH EVERY DAY FOR 21 DAYS, THEN DO NOT TAKE PLACEBO PILLS, TABLET-FREE FOR 7 DAYS, Disp: , Rfl: 3 .  Vitamin D, Ergocalciferol, (DRISDOL) 50000 units CAPS capsule, , Disp: , Rfl:    Allergies  Allergen Reactions  . Cheese Shortness Of Breath    Self diagnosed  . Penicillins      Review of Systems  Constitutional: Negative.  Respiratory: Negative.   Cardiovascular: Negative for chest pain, palpitations and leg swelling.  Musculoskeletal: Positive for neck pain and neck stiffness. Negative for back pain.  Neurological: Negative for dizziness, tingling, weakness, numbness and headaches.  Psychiatric/Behavioral: Negative.      Today's Vitals   02/08/19 1458  BP: 120/84  Pulse: 69  Temp: 97.9 F (36.6 C)  TempSrc: Oral  Weight: 244 lb 9.6 oz (110.9 kg)  Height: 5' 3.6" (1.615 m)  PainSc: 6   PainLoc: Neck   Body mass index is 42.52 kg/m.   Objective:  Physical Exam Constitutional:      Appearance: Normal appearance.  Cardiovascular:     Rate and Rhythm: Normal rate and regular  rhythm.     Pulses: Normal pulses.     Heart sounds: Normal heart sounds. No murmur.  Pulmonary:     Effort: Pulmonary effort is normal. No respiratory distress.     Breath sounds: Normal breath sounds.  Musculoskeletal:        General: No swelling or tenderness (right neck tenderness on palpation).     Comments: Limited range of motion with rotation to neck.    Skin:    Capillary Refill: Capillary refill takes less than 2 seconds.  Neurological:     General: No focal deficit present.     Mental Status: She is alert and oriented to person, place, and time.         Assessment And Plan:     1. Acute torticollis  Pain with movement and her muscles are tense.  Encouraged to use heating pad as needed.   - triamcinolone acetonide (KENALOG-40) injection 40 mg - cyclobenzaprine (FLEXERIL) 10 MG tablet; Take 1 tablet (10 mg total) by mouth 3 (three) times daily as needed for muscle spasms.  Dispense: 30 tablet; Refill: 0  2. Muscle strain  Use heating pads to neck and neck exercises.   - triamcinolone acetonide (KENALOG-40) injection 40 mg - cyclobenzaprine (FLEXERIL) 10 MG tablet; Take 1 tablet (10 mg total) by mouth 3 (three) times daily as needed for muscle spasms.  Dispense: 30 tablet; Refill: 0   Arnette Felts, FNP    THE PATIENT IS ENCOURAGED TO PRACTICE SOCIAL DISTANCING DUE TO THE COVID-19 PANDEMIC.

## 2019-02-08 NOTE — Patient Instructions (Signed)
Acute Torticollis, Adult Torticollis is a condition in which the muscles of the neck tighten (contract) abnormally, causing the neck to twist and the head to move into an unnatural position. Torticollis that develops suddenly is called acute torticollis. People with acute torticollis may have trouble turning their head. The condition can be painful and may range from mild to severe. What are the causes? This condition may be caused by:  Sleeping in an awkward position (common).  Extending or twisting the neck muscles beyond their normal position.  An injury to the neck muscles.  An infection.  A tumor.  Certain medicines.  Long-lasting spasms of the neck muscles. In some cases, the cause may not be known. What increases the risk? You are more likely to develop this condition if:  You have a condition associated with loose ligaments, such as Down syndrome.  You have a brain condition that affects vision, such as strabismus. What are the signs or symptoms? The main symptom of this condition is tilting of the head to one side. Other symptoms include:  Pain in the neck.  Trouble turning the head from side to side or up and down. How is this diagnosed? This condition may be diagnosed based on:  A physical exam.  Your medical history.  Imaging tests, such as: ? An X-ray. ? An ultrasound. ? A CT scan. ? An MRI. How is this treated? Treatment for this condition depends on what is causing the condition. Mild cases may go away without treatment. Treatment for more serious cases may include:  Medicines or shots to relax the muscles.  Other medicines, such as antibiotics to treat the underlying cause.  Wearing a soft neck collar.  Physical therapy and stretching to improve neck strength and flexibility.  Neck massage. In severe cases, surgery may be needed to repair dislocated or broken bones or to treat nerves in the neck. Follow these instructions at home:   Take  over-the-counter and prescription medicines only as told by your health care provider.  Do stretching exercises and massage your neck as told by your health care provider.  If directed, apply heat to the affected area as often as told by your health care provider. Use the heat source that your health care provider recommends, such as a moist heat pack or a heating pad. ? Place a towel between your skin and the heat source. ? Leave the heat on for 20-30 minutes. ? Remove the heat if your skin turns bright red. This is especially important if you are unable to feel pain, heat, or cold. You may have a greater risk of getting burned.  If you wake up with torticollis after sleeping, check your bed or sleeping area. Look for lumpy pillows or unusual objects. Make sure your bed and sleeping area are comfortable.  Keep all follow-up visits as told by your health care provider. This is important. Contact a health care provider if:  You have a fever.  Your symptoms do not improve or they get worse. Get help right away if:  You have trouble breathing.  You develop noisy breathing (stridor).  You start to drool.  You have trouble swallowing or pain when swallowing.  You develop numbness or weakness in your hands or feet.  You have changes in your speech, understanding, or vision.  You are in severe pain.  You cannot move your head or neck. Summary  Torticollis is a condition in which the muscles of the neck tighten (contract) abnormally, causing   the neck to twist and the head to move into an unnatural position. Torticollis that develops suddenly is called acute torticollis.  Treatment for this condition depends on what is causing the condition. Mild cases may go away without treatment.  Do stretching exercises and massage your neck as told by your health care provider. You may also be instructed to apply heat to the area.  Contact your health care provider if your symptoms do not  improve or they get worse. This information is not intended to replace advice given to you by your health care provider. Make sure you discuss any questions you have with your health care provider. Document Revised: 12/26/2016 Document Reviewed: 03/13/2016 Elsevier Patient Education  2020 Elsevier Inc.  

## 2019-02-16 ENCOUNTER — Encounter: Payer: Self-pay | Admitting: Nurse Practitioner

## 2019-02-16 ENCOUNTER — Ambulatory Visit: Payer: BC Managed Care – PPO | Admitting: Nurse Practitioner

## 2019-02-16 ENCOUNTER — Ambulatory Visit
Admission: RE | Admit: 2019-02-16 | Discharge: 2019-02-16 | Disposition: A | Discharge status: Home / Self Care | Payer: BC Managed Care – PPO | Source: Ambulatory Visit | Attending: Nurse Practitioner | Admitting: Nurse Practitioner

## 2019-02-16 ENCOUNTER — Other Ambulatory Visit: Payer: Self-pay

## 2019-02-16 VITALS — BP 132/78 | HR 91 | Temp 97.7°F | Ht 63.6 in | Wt 243.0 lb

## 2019-02-16 DIAGNOSIS — M542 Cervicalgia: Secondary | ICD-10-CM

## 2019-02-16 MED ORDER — GABAPENTIN 100 MG PO CAPS: 100.0000 mg | ORAL_CAPSULE | Freq: Three times a day (TID) | ORAL | 2 refills | Status: DC | PRN

## 2019-02-16 MED ORDER — GABAPENTIN 100 MG PO CAPS: 100.0000 mg | ORAL_CAPSULE | Freq: Three times a day (TID) | ORAL | 2 refills | Status: DC

## 2019-02-16 NOTE — Progress Notes (Signed)
This visit occurred during the SARS-CoV-2 public health emergency.  Safety protocols were in place, including screening questions prior to the visit, additional usage of staff PPE, and extensive cleaning of exam room while observing appropriate contact time as indicated for disinfecting solutions.  Subjective:     Patient ID: Belinda Barton , female    DOB: Mar 30, 1985 , 34 y.o.   MRN: [id number]   Chief Complaint  Patient presents with  . Neck Pain    HPI  Neck Pain  This is a recurrent problem. The current episode started 1 to 4 weeks ago (4-5 days ago). The problem occurs constantly. The problem has been gradually worsening. The pain is associated with a sleep position. The quality of the pain is described as aching and stabbing. The pain is moderate. The pain is same all the time. Pertinent negatives include no chest pain, headaches, numbness, paresis, tingling or weakness. Associated symptoms comments: Radiating down to right shoulder. She has tried acetaminophen and NSAIDs (extra strength tylenol and 800mg  ibuprofen - 2-3 days) for the symptoms.     Past Medical History:  Diagnosis Date  . Anxiety   . Asthma   . Herpes genitalis in women   . Vitamin D deficiency      Family History  Problem Relation Age of Onset  . Diabetes Mother   . Asthma Brother      Current Outpatient Medications:  .  albuterol (VENTOLIN HFA) 108 (90 Base) MCG/ACT inhaler, Inhale 2 puffs into the lungs every 6 (six) hours as needed for wheezing or shortness of breath., Disp: 1 Inhaler, Rfl: 1 .  beclomethasone (QVAR REDIHALER) 80 MCG/ACT inhaler, Inhale 2 puffs into the lungs 2 (two) times daily., Disp: 1 Inhaler, Rfl: 5 .  busPIRone (BUSPAR) 10 MG tablet, TAKE 1/2 TABLET(5 MG) BY MOUTH DAILY, Disp: 30 tablet, Rfl: 1 .  cyclobenzaprine (FLEXERIL) 10 MG tablet, Take 1 tablet (10 mg total) by mouth 3 (three) times daily as needed for muscle spasms., Disp: 30 tablet, Rfl: 0 .  famotidine (PEPCID) 20 MG  tablet, Take 1 tablet (20 mg total) by mouth 2 (two) times daily., Disp: 60 tablet, Rfl: 5 .  gabapentin (NEURONTIN) 100 MG capsule, Take 1 capsule (100 mg total) by mouth 3 (three) times daily., Disp: 90 capsule, Rfl: 2 .  ipratropium (ATROVENT) 0.06 % nasal spray, Place 2 sprays into both nostrils 4 (four) times daily., Disp: 15 mL, Rfl: 5 .  JUNEL FE 1/20 1-20 MG-MCG tablet, TAKE 1 TABLET BY MOUTH EVERY DAY FOR 21 DAYS, THEN DO NOT TAKE PLACEBO PILLS, TABLET-FREE FOR 7 DAYS, Disp: , Rfl: 3 .  levocetirizine (XYZAL) 5 MG tablet, Take 1 tablet (5 mg total) by mouth every evening., Disp: 30 tablet, Rfl: 5 .  lisdexamfetamine (VYVANSE) 20 MG capsule, Take 20 mg by mouth daily., Disp: , Rfl:  .  loratadine (CLARITIN) 10 MG tablet, Take 1 tablet (10 mg total) by mouth daily., Disp: 30 tablet, Rfl: 1 .  montelukast (SINGULAIR) 10 MG tablet, TAKE 1 TABLET BY MOUTH EVERYDAY AT BEDTIME, Disp: 90 tablet, Rfl: 1 .  triamcinolone (NASACORT) 55 MCG/ACT AERO nasal inhaler, Place 2 sprays into the nose daily., Disp: 1 Bottle, Rfl: 5 .  valACYclovir (VALTREX) 1000 MG tablet, Take 1,000 mg by mouth as needed. , Disp: , Rfl:  .  Vitamin D, Ergocalciferol, (DRISDOL) 50000 units CAPS capsule, , Disp: , Rfl:    Allergies  Allergen Reactions  . Cheese Shortness Of Breath  Self diagnosed  . Penicillins      Review of Systems  Constitutional: Negative.   Respiratory: Negative.   Cardiovascular: Negative for chest pain, palpitations and leg swelling.  Musculoskeletal: Positive for neck pain and neck stiffness. Negative for back pain.  Neurological: Negative for dizziness, tingling, weakness, numbness and headaches.  Psychiatric/Behavioral: Negative.      Today's Vitals   02/16/19 1147  BP: 132/78  Pulse: 91  Temp: 97.7 F (36.5 C)  Weight: 243 lb (110.2 kg)  Height: 5' 3.6" (1.615 m)   Body mass index is 42.24 kg/m.   Objective:  Physical Exam Constitutional:      Appearance: Normal appearance.   Cardiovascular:     Rate and Rhythm: Normal rate and regular rhythm.     Pulses: Normal pulses.     Heart sounds: Normal heart sounds. No murmur.  Pulmonary:     Effort: Pulmonary effort is normal. No respiratory distress.     Breath sounds: Normal breath sounds.  Musculoskeletal:        General: No swelling or tenderness (right neck tenderness on palpation).     Comments: Limited range of motion with rotation to neck.    Skin:    Capillary Refill: Capillary refill takes less than 2 seconds.  Neurological:     General: No focal deficit present.     Mental Status: She is alert and oriented to person, place, and time.         Assessment And Plan:     1. Neck pain  Continues to have right neck pain when changing positions   Will provide her with gabapentin as needed  I am also referring her to a chiropractor for further evaluation.   She is to go for an xray of cervical spine - DG Cervical Spine Complete; Future - Ambulatory referral to Chiropractic - gabapentin (NEURONTIN) 100 MG capsule; Take 1 capsule (100 mg total) by mouth 3 (three) times daily as needed.  Dispense: 90 capsule; Refill: 2      Arnette Felts, FNP    THE PATIENT IS ENCOURAGED TO PRACTICE SOCIAL DISTANCING DUE TO THE COVID-19 PANDEMIC.

## 2019-02-24 ENCOUNTER — Other Ambulatory Visit: Payer: Self-pay | Admitting: Nurse Practitioner

## 2019-02-24 DIAGNOSIS — F419 Anxiety disorder, unspecified: Secondary | ICD-10-CM

## 2019-02-24 NOTE — Telephone Encounter (Signed)
Has she been without this if not it is okay

## 2019-02-24 NOTE — Telephone Encounter (Signed)
The last time we filled it was in Sep I called her LVM

## 2019-02-24 NOTE — Telephone Encounter (Signed)
Refill request Buspirone 10mg 

## 2019-05-09 ENCOUNTER — Encounter: Payer: Self-pay | Admitting: Nurse Practitioner

## 2019-05-11 ENCOUNTER — Encounter: Payer: Self-pay | Admitting: Nurse Practitioner

## 2019-05-11 ENCOUNTER — Ambulatory Visit: Payer: BC Managed Care – PPO | Admitting: Nurse Practitioner

## 2019-05-11 ENCOUNTER — Other Ambulatory Visit: Payer: Self-pay

## 2019-05-11 VITALS — BP 138/88 | HR 98 | Temp 98.4°F | Ht 63.2 in | Wt 245.0 lb

## 2019-05-11 DIAGNOSIS — E559 Vitamin D deficiency, unspecified: Secondary | ICD-10-CM | POA: Diagnosis not present

## 2019-05-11 DIAGNOSIS — Z6841 Body Mass Index (BMI) 40.0 and over, adult: Secondary | ICD-10-CM

## 2019-05-11 DIAGNOSIS — Z0000 Encounter for general adult medical examination without abnormal findings: Secondary | ICD-10-CM

## 2019-05-11 NOTE — Patient Instructions (Addendum)
Health Maintenance, Female Adopting a healthy lifestyle and getting preventive care are important in promoting health and wellness. Ask your health care provider about:  The right schedule for you to have regular tests and exams.  Things you can do on your own to prevent diseases and keep yourself healthy. What should I know about diet, weight, and exercise? Eat a healthy diet   Eat a diet that includes plenty of vegetables, fruits, low-fat dairy products, and lean protein.  Do not eat a lot of foods that are high in solid fats, added sugars, or sodium. Maintain a healthy weight Body mass index (BMI) is used to identify weight problems. It estimates body fat based on height and weight. Your health care provider can help determine your BMI and help you achieve or maintain a healthy weight. Get regular exercise Get regular exercise. This is one of the most important things you can do for your health. Most adults should:  Exercise for at least 150 minutes each week. The exercise should increase your heart rate and make you sweat (moderate-intensity exercise).  Do strengthening exercises at least twice a week. This is in addition to the moderate-intensity exercise.  Spend less time sitting. Even light physical activity can be beneficial. Watch cholesterol and blood lipids Have your blood tested for lipids and cholesterol at 33 years of age, then have this test every 5 years. Have your cholesterol levels checked more often if:  Your lipid or cholesterol levels are high.  You are older than 33 years of age.  You are at high risk for heart disease. What should I know about cancer screening? Depending on your health history and family history, you may need to have cancer screening at various ages. This may include screening for:  Breast cancer.  Cervical cancer.  Colorectal cancer.  Skin cancer.  Lung cancer. What should I know about heart disease, diabetes, and high blood  pressure? Blood pressure and heart disease  High blood pressure causes heart disease and increases the risk of stroke. This is more likely to develop in people who have high blood pressure readings, are of African descent, or are overweight.  Have your blood pressure checked: ? Every 3-5 years if you are 18-39 years of age. ? Every year if you are 40 years old or older. Diabetes Have regular diabetes screenings. This checks your fasting blood sugar level. Have the screening done:  Once every three years after age 40 if you are at a normal weight and have a low risk for diabetes.  More often and at a younger age if you are overweight or have a high risk for diabetes. What should I know about preventing infection? Hepatitis B If you have a higher risk for hepatitis B, you should be screened for this virus. Talk with your health care provider to find out if you are at risk for hepatitis B infection. Hepatitis C Testing is recommended for:  Everyone born from 1945 through 1965.  Anyone with known risk factors for hepatitis C. Sexually transmitted infections (STIs)  Get screened for STIs, including gonorrhea and chlamydia, if: ? You are sexually active and are younger than 33 years of age. ? You are older than 33 years of age and your health care provider tells you that you are at risk for this type of infection. ? Your sexual activity has changed since you were last screened, and you are at increased risk for chlamydia or gonorrhea. Ask your health care provider if   you are at risk.  Ask your health care provider about whether you are at high risk for HIV. Your health care provider may recommend a prescription medicine to help prevent HIV infection. If you choose to take medicine to prevent HIV, you should first get tested for HIV. You should then be tested every 3 months for as long as you are taking the medicine. Pregnancy  If you are about to stop having your period (premenopausal) and  you may become pregnant, seek counseling before you get pregnant.  Take 400 to 800 micrograms (mcg) of folic acid every day if you become pregnant.  Ask for birth control (contraception) if you want to prevent pregnancy. Osteoporosis and menopause Osteoporosis is a disease in which the bones lose minerals and strength with aging. This can result in bone fractures. If you are 31 years old or older, or if you are at risk for osteoporosis and fractures, ask your health care provider if you should:  Be screened for bone loss.  Take a calcium or vitamin D supplement to lower your risk of fractures.  Be given hormone replacement therapy (HRT) to treat symptoms of menopause. Follow these instructions at home: Lifestyle  Do not use any products that contain nicotine or tobacco, such as cigarettes, e-cigarettes, and chewing tobacco. If you need help quitting, ask your health care provider.  Do not use street drugs.  Do not share needles.  Ask your health care provider for help if you need support or information about quitting drugs. Alcohol use  Do not drink alcohol if: ? Your health care provider tells you not to drink. ? You are pregnant, may be pregnant, or are planning to become pregnant.  If you drink alcohol: ? Limit how much you use to 0-1 drink a day. ? Limit intake if you are breastfeeding.  Be aware of how much alcohol is in your drink. In the [place] one drink equals one 12 oz bottle of beer (355 mL), one 5 oz glass of wine (148 mL), or one 1 oz glass of hard liquor (44 mL). General instructions  Schedule regular health, dental, and eye exams.  Stay current with your vaccines.  Tell your health care provider if: ? You often feel depressed. ? You have ever been abused or do not feel safe at home. Summary  Adopting a healthy lifestyle and getting preventive care are important in promoting health and wellness.  Follow your health care provider's instructions about healthy  diet, exercising, and getting tested or screened for diseases.  Follow your health care provider's instructions on monitoring your cholesterol and blood pressure. This information is not intended to replace advice given to you by your health care provider. Make sure you discuss any questions you have with your health care provider. Document Revised: 01/06/2018 Document Reviewed: 01/06/2018 Elsevier Patient Education  2020 Elsevier Inc.  COVID-19 Vaccine Information can be found at: [employer] For questions related to vaccine distribution or appointments, please email [email or web address] or call [phone].   Take over the counter vitamin d, zinc and calcium daily to help your immune system.

## 2019-05-11 NOTE — Progress Notes (Signed)
This visit occurred during the SARS-CoV-2 public health emergency.  Safety protocols were in place, including screening questions prior to the visit, additional usage of staff PPE, and extensive cleaning of exam room while observing appropriate contact time as indicated for disinfecting solutions.  Subjective:     Patient ID: Belinda Barton , female    DOB: 1985/11/08 , 34 y.o.   MRN: [id number]   Chief Complaint  Patient presents with  . Annual Exam    HPI  Here for HM   Wt Readings from Last 3 Encounters: 05/11/19 : 245 lb (111.1 kg) 02/16/19 : 243 lb (110.2 kg) 02/08/19 : 244 lb 9.6 oz (110.9 kg)  She has not had     The patient states she uses none for birth control.  Patient's last menstrual period was 05/02/2019 (approximate).. Negative for Dysmenorrhea and Negative for Menorrhagia she has not been on birth control for the last 9 months.  Negative for: breast discharge, breast lump(s), breast pain and breast self exam.  Pertinent negatives include abnormal bleeding (hematology), anxiety, decreased libido, depression, difficulty falling sleep, dyspareunia, history of infertility, nocturia, sexual dysfunction, sleep disturbances, urinary incontinence, urinary urgency, vaginal discharge and vaginal itching. Diet regular. The patient states her exercise level is minimal - daily for the last 2-3 weeks at least 45 minutes.    The patient's tobacco use is:  Social History   Tobacco Use  Smoking Status Former Smoker  Smokeless Tobacco Never Used   She has been exposed to passive smoke. The patient's alcohol use is:  Social History   Substance and Sexual Activity  Alcohol Use Yes    Past Medical History:  Diagnosis Date  . Anxiety   . Asthma   . Herpes genitalis in women   . Vitamin D deficiency      Family History  Problem Relation Age of Onset  . Diabetes Mother   . Asthma Brother      Current Outpatient Medications:  .  albuterol (VENTOLIN HFA) 108 (90 Base)  MCG/ACT inhaler, Inhale 2 puffs into the lungs every 6 (six) hours as needed for wheezing or shortness of breath., Disp: 1 Inhaler, Rfl: 1 .  beclomethasone (QVAR REDIHALER) 80 MCG/ACT inhaler, Inhale 2 puffs into the lungs 2 (two) times daily., Disp: 1 Inhaler, Rfl: 5 .  busPIRone (BUSPAR) 10 MG tablet, TAKE 1/2 TABLET(5 MG) BY MOUTH DAILY, Disp: 30 tablet, Rfl: 1 .  cyclobenzaprine (FLEXERIL) 10 MG tablet, Take 1 tablet (10 mg total) by mouth 3 (three) times daily as needed for muscle spasms., Disp: 30 tablet, Rfl: 0 .  famotidine (PEPCID) 20 MG tablet, Take 1 tablet (20 mg total) by mouth 2 (two) times daily., Disp: 60 tablet, Rfl: 5 .  gabapentin (NEURONTIN) 100 MG capsule, Take 1 capsule (100 mg total) by mouth 3 (three) times daily as needed., Disp: 90 capsule, Rfl: 2 .  ipratropium (ATROVENT) 0.06 % nasal spray, Place 2 sprays into both nostrils 4 (four) times daily., Disp: 15 mL, Rfl: 5 .  levocetirizine (XYZAL) 5 MG tablet, Take 1 tablet (5 mg total) by mouth every evening., Disp: 30 tablet, Rfl: 5 .  lisdexamfetamine (VYVANSE) 20 MG capsule, Take 20 mg by mouth daily., Disp: , Rfl:  .  loratadine (CLARITIN) 10 MG tablet, Take 1 tablet (10 mg total) by mouth daily., Disp: 30 tablet, Rfl: 1 .  montelukast (SINGULAIR) 10 MG tablet, TAKE 1 TABLET BY MOUTH EVERYDAY AT BEDTIME, Disp: 90 tablet, Rfl: 1 .  triamcinolone (NASACORT)  55 MCG/ACT AERO nasal inhaler, Place 2 sprays into the nose daily., Disp: 1 Bottle, Rfl: 5 .  valACYclovir (VALTREX) 1000 MG tablet, Take 1,000 mg by mouth as needed. , Disp: , Rfl:  .  Vitamin D, Ergocalciferol, (DRISDOL) 50000 units CAPS capsule, , Disp: , Rfl:  .  JUNEL FE 1/20 1-20 MG-MCG tablet, TAKE 1 TABLET BY MOUTH EVERY DAY FOR 21 DAYS, THEN DO NOT TAKE PLACEBO PILLS, TABLET-FREE FOR 7 DAYS, Disp: , Rfl: 3   Allergies  Allergen Reactions  . Cheese Shortness Of Breath    Self diagnosed  . Penicillins      Review of Systems   Today's Vitals   05/11/19  1556  BP: 138/88  Pulse: 98  Temp: 98.4 F (36.9 C)  TempSrc: Oral  Weight: 245 lb (111.1 kg)  Height: 5' 3.2" (1.605 m)   Body mass index is 43.13 kg/m.   Objective:  Physical Exam Constitutional:      General: She is not in acute distress.    Appearance: Normal appearance. She is well-developed. She is obese.  HENT:     Head: Normocephalic and atraumatic.     Right Ear: Hearing, tympanic membrane, ear canal and external ear normal. There is no impacted cerumen.     Left Ear: Hearing, tympanic membrane, ear canal and external ear normal. There is no impacted cerumen.     Nose:     Comments: Deferred - mask     Mouth/Throat:     Comments: Deferred - mask Eyes:     General: Lids are normal.     Extraocular Movements: Extraocular movements intact.     Conjunctiva/sclera: Conjunctivae normal.     Pupils: Pupils are equal, round, and reactive to light.     Funduscopic exam:    Right eye: No papilledema.        Left eye: No papilledema.  Neck:     Thyroid: No thyroid mass.     Vascular: No carotid bruit.  Cardiovascular:     Rate and Rhythm: Normal rate and regular rhythm.     Pulses: Normal pulses.     Heart sounds: Normal heart sounds. No murmur.  Pulmonary:     Effort: Pulmonary effort is normal.     Breath sounds: Normal breath sounds.  Abdominal:     General: Abdomen is flat. Bowel sounds are normal.     Palpations: Abdomen is soft.  Musculoskeletal:        General: No swelling. Normal range of motion.     Cervical back: Full passive range of motion without pain, normal range of motion and neck supple.     Right lower leg: No edema.     Left lower leg: No edema.  Skin:    General: Skin is warm and dry.     Capillary Refill: Capillary refill takes less than 2 seconds.  Neurological:     General: No focal deficit present.     Mental Status: She is alert and oriented to person, place, and time.     Cranial Nerves: No cranial nerve deficit.     Sensory: No  sensory deficit.  Psychiatric:        Mood and Affect: Mood normal.        Behavior: Behavior normal.        Thought Content: Thought content normal.        Judgment: Judgment normal.         Assessment And Plan:  1. Health maintenance examination Behavior modifications discussed and diet history reviewed.   Pt will continue to exercise regularly and modify diet with low GI, plant based foods and decrease intake of processed foods.  Recommend intake of daily multivitamin, Vitamin D, and calcium.  Recommend for preventive screenings, as well as recommend immunizations that include influenza, TDAP - Lipid panel - CMP14+EGFR - Hemoglobin A1c - CBC  2. Vitamin D deficiency Will check vitamin D level and supplement as needed.    Also encouraged to spend 15 minutes in the sun daily.  - VITAMIN D 25 Hydroxy (Vit-D Deficiency, Fractures)  3. Class 3 severe obesity due to excess calories without serious comorbidity with body mass index (BMI) of 40.0 to 44.9 in adult [facility])  Chronic  Encouraged to exercise at least 150 minutes weekly   Minette Brine, FNP    THE PATIENT IS ENCOURAGED TO PRACTICE SOCIAL DISTANCING DUE TO THE COVID-19 PANDEMIC.

## 2019-05-12 ENCOUNTER — Other Ambulatory Visit: Payer: Self-pay

## 2019-05-12 LAB — CMP14+EGFR
ALT: 19 IU/L (ref 0–32)
AST: 20 IU/L (ref 0–40)
Albumin/Globulin Ratio: 1.5 (ref 1.2–2.2)
Albumin: 4.5 g/dL (ref 3.8–4.8)
Alkaline Phosphatase: 65 IU/L (ref 39–117)
BUN/Creatinine Ratio: 10 (ref 9–23)
BUN: 10 mg/dL (ref 6–20)
Bilirubin Total: 0.4 mg/dL (ref 0.0–1.2)
CO2: 24 mmol/L (ref 20–29)
Calcium: 9.6 mg/dL (ref 8.7–10.2)
Chloride: 102 mmol/L (ref 96–106)
Creatinine, Ser: 1.01 mg/dL — ABNORMAL HIGH (ref 0.57–1.00)
GFR calc Af Amer: 85 mL/min/{1.73_m2} (ref >59)
GFR calc non Af Amer: 74 mL/min/{1.73_m2} (ref >59)
Globulin, Total: 3.1 g/dL (ref 1.5–4.5)
Glucose: 90 mg/dL (ref 65–99)
Potassium: 4.2 mmol/L (ref 3.5–5.2)
Sodium: 141 mmol/L (ref 134–144)
Total Protein: 7.6 g/dL (ref 6.0–8.5)

## 2019-05-12 LAB — CBC
Hematocrit: 42.5 % (ref 34.0–46.6)
Hemoglobin: 14.5 g/dL (ref 11.1–15.9)
MCH: 30 pg (ref 26.6–33.0)
MCHC: 34.1 g/dL (ref 31.5–35.7)
MCV: 88 fL (ref 79–97)
Platelets: 349 10*3/uL (ref 150–450)
RBC: 4.84 x10E6/uL (ref 3.77–5.28)
RDW: 13 % (ref 11.7–15.4)
WBC: 6.6 10*3/uL (ref 3.4–10.8)

## 2019-05-12 LAB — LIPID PANEL
Chol/HDL Ratio: 3.6 ratio (ref 0.0–4.4)
Cholesterol, Total: 169 mg/dL (ref 100–199)
HDL: 47 mg/dL (ref >39)
LDL Chol Calc (NIH): 107 mg/dL — ABNORMAL HIGH (ref 0–99)
Triglycerides: 82 mg/dL (ref 0–149)
VLDL Cholesterol Cal: 15 mg/dL (ref 5–40)

## 2019-05-12 LAB — HEMOGLOBIN A1C
Est. average glucose Bld gHb Est-mCnc: 111 mg/dL
Hgb A1c MFr Bld: 5.5 % (ref 4.8–5.6)

## 2019-05-12 LAB — VITAMIN D 25 HYDROXY (VIT D DEFICIENCY, FRACTURES): Vit D, 25-Hydroxy: 20.9 ng/mL — ABNORMAL LOW (ref 30.0–100.0)

## 2019-05-12 MED ORDER — VITAMIN D (ERGOCALCIFEROL) 1.25 MG (50000 UNIT) PO CAPS: 50000.0000 [IU] | ORAL_CAPSULE | ORAL | 0 refills | Status: DC

## 2019-05-16 ENCOUNTER — Other Ambulatory Visit: Payer: Self-pay

## 2019-05-16 DIAGNOSIS — J3089 Other allergic rhinitis: Secondary | ICD-10-CM

## 2019-05-16 MED ORDER — LEVOCETIRIZINE DIHYDROCHLORIDE 5 MG PO TABS: 5.0000 mg | ORAL_TABLET | Freq: Every evening | ORAL | 0 refills | Status: DC

## 2019-06-01 ENCOUNTER — Telehealth: Payer: Self-pay

## 2019-06-01 NOTE — Telephone Encounter (Signed)
Patient consented to virtual appointment on 06/02/19. YL,RMA

## 2019-06-02 ENCOUNTER — Encounter: Payer: Self-pay | Admitting: Nurse Practitioner

## 2019-06-02 ENCOUNTER — Other Ambulatory Visit: Payer: Self-pay

## 2019-06-02 ENCOUNTER — Telehealth (INDEPENDENT_AMBULATORY_CARE_PROVIDER_SITE_OTHER): Payer: BC Managed Care – PPO | Admitting: Nurse Practitioner

## 2019-06-02 VITALS — Temp 96.6°F

## 2019-06-02 DIAGNOSIS — J3489 Other specified disorders of nose and nasal sinuses: Secondary | ICD-10-CM

## 2019-06-02 DIAGNOSIS — R05 Cough: Secondary | ICD-10-CM | POA: Diagnosis not present

## 2019-06-02 DIAGNOSIS — R059 Cough, unspecified: Secondary | ICD-10-CM

## 2019-06-02 MED ORDER — AZITHROMYCIN 250 MG PO TABS: ORAL_TABLET | ORAL | 0 refills | Status: AC

## 2019-06-02 NOTE — Progress Notes (Signed)
Virtual Visit via Video   This visit type was conducted due to national recommendations for restrictions regarding the COVID-19 Pandemic (e.g. social distancing) in an effort to limit this patient's exposure and mitigate transmission in our community.  Due to her co-morbid illnesses, this patient is at least at moderate risk for complications without adequate follow up.  This format is felt to be most appropriate for this patient at this time.  All issues noted in this document were discussed and addressed.  A limited physical exam was performed with this format.    This visit type was conducted due to national recommendations for restrictions regarding the COVID-19 Pandemic (e.g. social distancing) in an effort to limit this patient's exposure and mitigate transmission in our community.  Patients identity confirmed using two different identifiers.  This format is felt to be most appropriate for this patient at this time.  All issues noted in this document were discussed and addressed.  No physical exam was performed (except for noted visual exam findings with Video Visits).    Date:  06/02/2019   ID:  Belinda Barton, DOB [birth year], MRN [id number]  Patient Location:  Home - spoke with Belinda Barton  Provider location:   Office    Chief Complaint:  Possible sinus infection  History of Present Illness:    Belinda Barton is a 34 y.o. female who presents via video conferencing for a telehealth visit today.    The patient does have symptoms concerning for COVID-19 infection (fever, chills, cough, or new shortness of breath).   She has called out of work due to this.   Sinus Problem This is a recurrent problem. The current episode started in the past 7 days (since Monday). The problem has been gradually worsening since onset. Associated symptoms include congestion, coughing (when she first wakes up) and sinus pressure. Pertinent negatives include no chills, shortness of breath, sneezing  or sore throat. Treatments tried: nasal spray, thera flu. The treatment provided no relief.     Past Medical History:  Diagnosis Date  . Anxiety   . Asthma   . Herpes genitalis in women   . Vitamin D deficiency    History reviewed. No pertinent surgical history.   Current Meds  Medication Sig  . beclomethasone (QVAR REDIHALER) 80 MCG/ACT inhaler Inhale 2 puffs into the lungs 2 (two) times daily.  . busPIRone (BUSPAR) 10 MG tablet TAKE 1/2 TABLET(5 MG) BY MOUTH DAILY  . famotidine (PEPCID) 20 MG tablet Take 1 tablet (20 mg total) by mouth 2 (two) times daily.  Marland Kitchen gabapentin (NEURONTIN) 100 MG capsule Take 1 capsule (100 mg total) by mouth 3 (three) times daily as needed.  Marland Kitchen ipratropium (ATROVENT) 0.06 % nasal spray Place 2 sprays into both nostrils 4 (four) times daily.  Marland Kitchen levocetirizine (XYZAL) 5 MG tablet Take 1 tablet (5 mg total) by mouth every evening.  . lisdexamfetamine (VYVANSE) 20 MG capsule Take 20 mg by mouth daily.  . montelukast (SINGULAIR) 10 MG tablet TAKE 1 TABLET BY MOUTH EVERYDAY AT BEDTIME  . triamcinolone (NASACORT) 55 MCG/ACT AERO nasal inhaler Place 2 sprays into the nose daily.  . valACYclovir (VALTREX) 1000 MG tablet Take 1,000 mg by mouth as needed.      Allergies:   Cheese and Penicillins   Social History   Tobacco Use  . Smoking status: Former Games developer  . Smokeless tobacco: Never Used  Substance Use Topics  . Alcohol use: Yes  . Drug use: No  Family Hx: The patient's family history includes Asthma in her brother; Diabetes in her mother.  ROS:   Please see the history of present illness.    Review of Systems  Constitutional: Negative for chills.  HENT: Positive for congestion and sinus pressure. Negative for sneezing and sore throat.        Nasal drainage was clear now greenish brown  Eyes: Negative.   Respiratory: Positive for cough (when she first wakes up). Negative for shortness of breath.   Cardiovascular: Negative.   Neurological:  Negative for dizziness and tingling.  Psychiatric/Behavioral: Negative.     All other systems reviewed and are negative.   Labs/Other Tests and Data Reviewed:    Recent Labs: 05/11/2019: ALT 19; BUN 10; Creatinine, Ser 1.01; Hemoglobin 14.5; Platelets 349; Potassium 4.2; Sodium 141   Recent Lipid Panel Lab Results  Component Value Date/Time   CHOL 169 05/11/2019 05:03 PM   TRIG 82 05/11/2019 05:03 PM   HDL 47 05/11/2019 05:03 PM   CHOLHDL 3.6 05/11/2019 05:03 PM   LDLCALC 107 (H) 05/11/2019 05:03 PM    Wt Readings from Last 3 Encounters:  05/11/19 245 lb (111.1 kg)  02/16/19 243 lb (110.2 kg)  02/08/19 244 lb 9.6 oz (110.9 kg)     Exam:    Vital Signs:  Temp (!) 96.6 F (35.9 C) (Oral)   LMP 05/26/2019 (Approximate)     Physical Exam  Constitutional: She is well-developed, well-nourished, and in no distress. No distress.  Pulmonary/Chest: Effort normal. No respiratory distress.  Psychiatric: Mood, memory and affect normal.    ASSESSMENT & PLAN:    1. Sinus pressure  Reported sinus pressure, has taken ipratropium nasal spray, levocetirizine and montelukast without benefit  Will treat with azithromycin for possible sinus infection  2. Cough  Worse upon awakening likely due to nasal drainage, she can use the nasal spray up to 4 times a day.  I have advised her to go to [facility] tomorrow morning for covid testing she has not had the vaccine    COVID-19 Education: The signs and symptoms of COVID-19 were discussed with the patient and how to seek care for testing (follow up with PCP or arrange E-visit).  The importance of social distancing was discussed today.  Patient Risk:   After full review of this patients clinical status, I feel that they are at least moderate risk at this time.  Time:   Today, I have spent 11 minutes/ seconds with the patient with telehealth technology discussing above diagnoses.     Medication Adjustments/Labs and Tests  Ordered: Current medicines are reviewed at length with the patient today.  Concerns regarding medicines are outlined above.   Tests Ordered: No orders of the defined types were placed in this encounter.   Medication Changes: Meds ordered this encounter  Medications  . azithromycin (ZITHROMAX) 250 MG tablet    Sig: Take 2 tablets (500 mg) on  Day 1,  followed by 1 tablet (250 mg) once daily on Days 2 through 5.    Dispense:  6 each    Refill:  0    Disposition:  Follow up prn  Signed, Minette Brine, FNP

## 2019-06-14 ENCOUNTER — Other Ambulatory Visit: Payer: Self-pay | Admitting: Nurse Practitioner

## 2019-06-14 DIAGNOSIS — F419 Anxiety disorder, unspecified: Secondary | ICD-10-CM

## 2019-06-22 ENCOUNTER — Encounter: Payer: Self-pay | Admitting: Allergy

## 2019-06-22 ENCOUNTER — Ambulatory Visit: Payer: BC Managed Care – PPO | Admitting: Allergy

## 2019-06-22 ENCOUNTER — Other Ambulatory Visit: Payer: Self-pay

## 2019-06-22 DIAGNOSIS — J45990 Exercise induced bronchospasm: Secondary | ICD-10-CM

## 2019-06-22 DIAGNOSIS — J310 Chronic rhinitis: Secondary | ICD-10-CM

## 2019-06-22 DIAGNOSIS — J3089 Other allergic rhinitis: Secondary | ICD-10-CM

## 2019-06-22 MED ORDER — LEVOCETIRIZINE DIHYDROCHLORIDE 5 MG PO TABS: 5.0000 mg | ORAL_TABLET | Freq: Every evening | ORAL | 5 refills | Status: DC

## 2019-06-22 MED ORDER — FAMOTIDINE 20 MG PO TABS: 20.0000 mg | ORAL_TABLET | Freq: Two times a day (BID) | ORAL | 5 refills | Status: DC

## 2019-06-22 MED ORDER — MONTELUKAST SODIUM 10 MG PO TABS: ORAL_TABLET | ORAL | 1 refills | Status: DC

## 2019-06-22 MED ORDER — QVAR REDIHALER 80 MCG/ACT IN AERB: 2.0000 | INHALATION_SPRAY | Freq: Two times a day (BID) | RESPIRATORY_TRACT | 5 refills | Status: DC

## 2019-06-22 MED ORDER — ALBUTEROL SULFATE HFA 108 (90 BASE) MCG/ACT IN AERS: 2.0000 | INHALATION_SPRAY | Freq: Four times a day (QID) | RESPIRATORY_TRACT | 1 refills | Status: DC | PRN

## 2019-06-22 MED ORDER — IPRATROPIUM BROMIDE 0.06 % NA SOLN: 2.0000 | Freq: Four times a day (QID) | NASAL | 5 refills | Status: DC

## 2019-06-22 NOTE — Patient Instructions (Addendum)
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    Your milk serum IgE and cheese (moldy type) IgE were negative on previous blood testing and previous skin testing to milk was negative.     - skin testing previously for tomato and shrimp were negative but you did have serum IgE detectable levels to both tomato and shrimp thus this could represent IgE mediated food allergy.  Would continue to avoid these foods and have access to your Epipen.      - will obtain complete basic food allergen panel with increased in GI symptoms following food ingestion to determine if you have any more sensitivities other than tomato and shrimp.      - can take Pepcid 20mg  twice a day to see if this will help prevent and management GI symptoms with ingestion    -use nasal Atrovent 2 sprays as needed up to 3-4 times a day if your going to eat cheese or other foods or situations where you develop nasal congestion and drainage.  You can use prior to eating cheese to help decrease nasal drainage or after you eat cheeses to help dry up the nasal drainage.    Exercise-induced asthma/Reactive airway disease   - continue Singulair 10mg  daily-- take at bedtime   - use Qvar 2 puffs twice a day    - have access to albuterol inhaler 2 puffs every 4-6 hours as needed for cough/wheeze/shortness of breath/chest tightness.  May use 15-20 minutes prior to activity.   Monitor frequency of use.   Asthma control goals:   Full participation in all desired activities (may need albuterol before activity)  Albuterol use two time or less a week on average (not counting use with activity)  Cough interfering with sleep two time or less a month  Oral steroids no more than once a year  No hospitalizations  Allergic rhinitis    - continue Xyzal 5mg  daily    - as above use nasal Atrovent and for allergy symptom control use 2 sprays each nostril twice a day.      - can hold  your Nasacort if using Atrovent for allergy control.  If having increased nasal congestion can add in Nasacort 2 sprays each nostril daily    - continue allergen avoidance grasses, weeds, trees, mold (botrytis cinera), dust mites, dog, cockroach.  Would not recommend use of humidifier as this releases moisture into the air and dust mites live/thrive on moisture and will be more rampant in your environment.  Would recommend an air purifier with HEPA filtration instead.   You can purchase smaller portable air filters for use in your work space.     Follow-up 6 months or sooner if needed

## 2019-06-22 NOTE — Progress Notes (Signed)
Follow-up Note  RE: Belinda Barton MRN: [id number] DOB: 01/10/1986 Date of Office Visit: 06/22/2019   History of present illness: Belinda Barton is a 34 y.o. female presenting today for follow-up of gustatory rhinitis, adverse food reaction, asthma and allergic rhinitis.  She was last seen in the office on 12/22/2018 by myself.  She states she has been doing okay since her last visit without any major health changes.  In regards to her asthma she states she can definitely tell when she is using her Qvar as prescribed which is 2 puffs twice a day.  When she is not taking it this way she does have more symptoms and require albuterol use.  She states she has been taking it as prescribed.  She also takes Singulair daily.  Thus she has not required any ED or urgent care visits or any systemic steroid needs.  Denies any nighttime awakenings.  With her allergic rhinitis she states she just ran out of her Xyzal last night.  When the season changed where the weather changed from cold to warm and back to cold she developed a sinus infection and was treated with antibiotic.  She has been recommended to have access and use both Nasacort and ipratropium depending on her symptoms.  She states she has been using ipratropium 1 spray in the mornings however she still has some nasal congestion and drainage.  She has finally given up cheese/diary.  In the past has caused her to have nasal drainage and congestion.  Review of systems: Review of Systems  Constitutional: Negative.   HENT: Positive for congestion.   Eyes: Negative.   Respiratory: Negative.   Cardiovascular: Negative.   Gastrointestinal: Negative.   Musculoskeletal: Negative.   Skin: Negative.   Neurological: Negative.     All other systems negative unless noted above in HPI  Past medical/social/surgical/family history have been reviewed and are unchanged unless specifically indicated below.  No changes  Medication List: Current  Outpatient Medications  Medication Sig Dispense Refill  . beclomethasone (QVAR REDIHALER) 80 MCG/ACT inhaler Inhale 2 puffs into the lungs 2 (two) times daily. 10.6 g 5  . busPIRone (BUSPAR) 10 MG tablet TAKE 1/2 TABLET(5 MG) BY MOUTH DAILY 30 tablet 1  . famotidine (PEPCID) 20 MG tablet Take 1 tablet (20 mg total) by mouth 2 (two) times daily. 60 tablet 5  . gabapentin (NEURONTIN) 100 MG capsule Take 1 capsule (100 mg total) by mouth 3 (three) times daily as needed. 90 capsule 2  . ipratropium (ATROVENT) 0.06 % nasal spray Place 2 sprays into both nostrils 4 (four) times daily. 15 mL 5  . levocetirizine (XYZAL) 5 MG tablet Take 1 tablet (5 mg total) by mouth every evening. 30 tablet 5  . lisdexamfetamine (VYVANSE) 20 MG capsule Take 20 mg by mouth daily.    . montelukast (SINGULAIR) 10 MG tablet TAKE 1 TABLET BY MOUTH EVERYDAY AT BEDTIME 90 tablet 1  . valACYclovir (VALTREX) 1000 MG tablet Take 1,000 mg by mouth as needed.     Marland Kitchen albuterol (VENTOLIN HFA) 108 (90 Base) MCG/ACT inhaler Inhale 2 puffs into the lungs every 6 (six) hours as needed for wheezing or shortness of breath. 18 g 1   No current facility-administered medications for this visit.     Known medication allergies: Allergies  Allergen Reactions  . Cheese Shortness Of Breath    Self diagnosed  . Penicillins      Physical examination: Blood pressure (!) 124/98, pulse 97, temperature (!)  97.5 F (36.4 C), temperature source Temporal, resp. rate 18, last menstrual period 05/26/2019, SpO2 98 %.  General: Alert, interactive, in no acute distress. HEENT: PERRLA, TMs pearly gray, turbinates moderately edematous with clear discharge, post-pharynx non erythematous. Neck: Supple without lymphadenopathy. Lungs: Clear to auscultation without wheezing, rhonchi or rales. {no increased work of breathing. CV: Normal S1, S2 without murmurs. Abdomen: Nondistended, nontender. Skin: Warm and dry, without lesions or rashes. Extremities:   No clubbing, cyanosis or edema. Neuro:   Grossly intact.  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    Your milk serum IgE and cheese (moldy type) IgE were negative on previous blood testing and previous skin testing to milk was negative.     - skin testing previously for tomato and shrimp were negative but you did have serum IgE detectable levels to both tomato and shrimp thus this could represent IgE mediated food allergy.  Would continue to avoid these foods and have access to your Epipen.      - will obtain complete basic food allergen panel with increased in GI symptoms following food ingestion to determine if you have any more sensitivities other than tomato and shrimp.      - can take Pepcid 20mg  twice a day to see if this will help prevent and management GI symptoms with ingestion    -use nasal Atrovent 2 sprays as needed up to 3-4 times a day if your going to eat cheese or other foods or situations where you develop nasal congestion and drainage.  You can use prior to eating cheese to help decrease nasal drainage or after you eat cheeses to help dry up the nasal drainage.    Exercise-induced asthma/Reactive airway disease   - continue Singulair 10mg  daily-- take at bedtime   - use Qvar 48mcg 2 puffs twice a day    - have access to albuterol inhaler 2 puffs every 4-6 hours as needed for cough/wheeze/shortness of breath/chest tightness.  May use 15-20 minutes prior to activity.   Monitor frequency of use.   Asthma control goals:   Full participation in all desired activities (may need albuterol before activity)  Albuterol use two time or less a week on average (not counting use with activity)  Cough interfering with sleep two time or less a month  Oral steroids no more than once a year  No hospitalizations  Allergic rhinitis    - continue Xyzal 5mg  daily     - as above use nasal Atrovent and for allergy symptom control use 2 sprays each nostril twice a day.      - can hold your Nasacort if using Atrovent for allergy control.  If having increased nasal congestion can add in Nasacort 2 sprays each nostril daily    - continue allergen avoidance grasses, weeds, trees, mold (botrytis cinera), dust mites, dog, cockroach.  Would not recommend use of humidifier as this releases moisture into the air and dust mites live/thrive on moisture and will be more rampant in your environment.  Would recommend an air purifier with HEPA filtration instead.   You can purchase smaller portable air filters for use in your work space.     Follow-up 6 months or sooner if needed  I appreciate the opportunity to take part in Reesa's care. Please do not hesitate to contact me with questions.  Sincerely,   Prudy Feeler, MD  Allergy/Immunology [facility]

## 2019-06-26 LAB — ALLERGEN MILK: Milk IgE: <0.1 kU/L

## 2019-06-26 LAB — ALLERGEN PROFILE, FOOD-FISH
Allergen Mackerel IgE: <0.1 kU/L
Allergen Salmon IgE: <0.1 kU/L
Allergen Trout IgE: <0.1 kU/L
Allergen Walley Pike IgE: <0.1 kU/L
Codfish IgE: <0.1 kU/L
Halibut IgE: <0.1 kU/L
Tuna: <0.1 kU/L

## 2019-06-26 LAB — ALLERGEN, CASEIN, F78: F078-IgE Casein: <0.1 kU/L

## 2019-06-26 LAB — ALLERGEN SOYBEAN: Soybean IgE: 0.2 kU/L — AB

## 2019-06-26 LAB — ALLERGENS(7)
Brazil Nut IgE: <0.1 kU/L
F020-IgE Almond: 0.31 kU/L — AB
F202-IgE Cashew Nut: <0.1 kU/L
Hazelnut (Filbert) IgE: 0.25 kU/L — AB
Peanut IgE: 0.86 kU/L — AB
Pecan Nut IgE: <0.1 kU/L
Walnut IgE: 0.33 kU/L — AB

## 2019-06-26 LAB — ALLERGEN EGG WHITE F1: Egg White IgE: <0.1 kU/L

## 2019-06-26 LAB — ALLERGEN PROFILE, SHELLFISH
Clam IgE: 0.16 kU/L — AB
F023-IgE Crab: 0.31 kU/L — AB
F080-IgE Lobster: 0.44 kU/L — AB
F290-IgE Oyster: <0.1 kU/L
Scallop IgE: <0.1 kU/L
Shrimp IgE: 0.43 kU/L — AB

## 2019-06-26 LAB — ALLERGEN, WHEAT, F4: Wheat IgE: 0.62 kU/L — AB

## 2019-06-26 LAB — ALLERGEN SESAME F10: Sesame Seed IgE: 0.79 kU/L — AB

## 2019-07-14 ENCOUNTER — Other Ambulatory Visit: Payer: Self-pay | Admitting: Allergy

## 2019-12-07 ENCOUNTER — Encounter: Payer: Self-pay | Admitting: Nurse Practitioner

## 2019-12-07 ENCOUNTER — Ambulatory Visit: Payer: BC Managed Care – PPO | Admitting: Nurse Practitioner

## 2019-12-07 ENCOUNTER — Other Ambulatory Visit: Payer: Self-pay

## 2019-12-07 DIAGNOSIS — F419 Anxiety disorder, unspecified: Secondary | ICD-10-CM

## 2019-12-07 MED ORDER — CLONAZEPAM 0.5 MG PO TABS: 0.5000 mg | ORAL_TABLET | Freq: Two times a day (BID) | ORAL | 0 refills | Status: DC | PRN

## 2019-12-07 MED ORDER — BUSPIRONE HCL 15 MG PO TABS: 15.0000 mg | ORAL_TABLET | Freq: Three times a day (TID) | ORAL | 2 refills | Status: DC

## 2019-12-07 NOTE — Progress Notes (Signed)
I,Yamilka Roman [employer] as a Neurosurgeon for [employer], FNP.,have documented all relevant documentation on the behalf of Arnette Felts, FNP,as directed by  Arnette Felts, FNP while in the presence of Arnette Felts, FNP. This visit occurred during the SARS-CoV-2 public health emergency.  Safety protocols were in place, including screening questions prior to the visit, additional usage of staff PPE, and extensive cleaning of exam room while observing appropriate contact time as indicated for disinfecting solutions.  Subjective:     Patient ID: Belinda Barton , female    DOB: 11-Oct-1985 , 34 y.o.   MRN: [id number]   Chief Complaint  Patient presents with  . Anxiety    patient stated her anxiety has been getting worse sinc sept. she stated she feels as if she is losing control     HPI  Patient is here for her anxiety she stated it has gotten worse since sept. She feels as if she is losing control when she is having an episode. She feels like her job has triggered  Her panic attacks.  Every Monday morning when she goes in to work she will have.  She was up at 2 am and back up at 4 am.  She is seeing Dr. Elisabeth Most is following her for her ADHD she began taking a 1/2 tab (10mg ) buspirone twice a day.  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    Anxiety Presents for follow-up visit. Patient reports no chest pain, decreased concentration, dizziness, insomnia, irritability or palpitations. Symptoms occur most days. The severity of symptoms is moderate (she is doing well with the medication). The quality of sleep is fair. Nighttime awakenings: none.       Past Medical History:  Diagnosis Date  . Anxiety   . Asthma   . Herpes genitalis in women   . Vitamin D deficiency      Family History  Problem Relation Age of Onset  . Diabetes Mother   . Asthma Brother      Current Outpatient Medications:  .  albuterol (VENTOLIN HFA) 108 (90  Base) MCG/ACT inhaler, Inhale 2 puffs into the lungs every 6 (six) hours as needed for wheezing or shortness of breath., Disp: 18 g, Rfl: 1 .  beclomethasone (QVAR REDIHALER) 80 MCG/ACT inhaler, Inhale 2 puffs into the lungs 2 (two) times daily., Disp: 10.6 g, Rfl: 5 .  famotidine (PEPCID) 20 MG tablet, Take 1 tablet (20 mg total) by mouth 2 (two) times daily., Disp: 60 tablet, Rfl: 5 .  gabapentin (NEURONTIN) 100 MG capsule, Take 1 capsule (100 mg total) by mouth 3 (three) times daily as needed., Disp: 90 capsule, Rfl: 2 .  ipratropium (ATROVENT) 0.06 % nasal spray, Place 2 sprays into both nostrils 4 (four) times daily., Disp: 15 mL, Rfl: 5 .  lisdexamfetamine (VYVANSE) 20 MG capsule, Take 20 mg by mouth daily., Disp: , Rfl:  .  montelukast (SINGULAIR) 10 MG tablet, TAKE 1 TABLET BY MOUTH EVERY DAY AT BEDTIME, Disp: 90 tablet, Rfl: 0 .  valACYclovir (VALTREX) 1000 MG tablet, Take 1,000 mg by mouth as needed. , Disp: , Rfl:  .  busPIRone (BUSPAR) 15 MG tablet, Take 1 tablet (15 mg total) by mouth 3 (three) times daily., Disp: 60 tablet, Rfl: 2 .  clonazePAM (KLONOPIN) 0.5 MG tablet, Take 1 tablet (0.5 mg total) by mouth 2 (two) times daily as needed for anxiety., Disp: 20 tablet, Rfl: 0 .  levocetirizine (XYZAL)  5 MG tablet, Take 1 tablet (5 mg total) by mouth every evening., Disp: 30 tablet, Rfl: 5   Allergies  Allergen Reactions  . Cheese Shortness Of Breath    Self diagnosed  . Penicillins      Review of Systems  Constitutional: Negative for irritability.  Cardiovascular: Negative for chest pain and palpitations.  Neurological: Negative for dizziness.  Psychiatric/Behavioral: Negative for decreased concentration. The patient does not have insomnia.      Today's Vitals   12/07/19 1000  BP: 130/72  Pulse: (!) 111  Temp: 98.1 F (36.7 C)  TempSrc: Oral  Weight: 245 lb 12.8 oz (111.5 kg)  Height: 5\' 3"  (1.6 m)  PainSc: 0-No pain   Body mass index is 43.54 kg/m.   Objective:   Physical Exam Vitals reviewed.  Constitutional:      General: She is not in acute distress.    Appearance: Normal appearance. She is obese.  Pulmonary:     Effort: Pulmonary effort is normal. No respiratory distress.  Neurological:     Mental Status: She is alert.  Psychiatric:        Thought Content: Thought content normal.        Judgment: Judgment normal.     Comments: Flat affect with crying during her visit         Assessment And Plan:     1. Anxiety  Her symptoms are worsening and had a long discussion about changing her medications and considering counseling/and possibly taking some time off. She is declining the time off due to fear of having more issues at work.  She was extremely emotional during her visit.  I will increase her buspirone. I will also provide her with a limited amount of clonazepam to help with her sleep. I have discussed with her not to take when operating heavy machinery or driving. She is also encouraged to avoid while working in the event this causes her drowsiness. She is to follow up in 4 weeks for medication change check.  - busPIRone (BUSPAR) 15 MG tablet; Take 1 tablet (15 mg total) by mouth 3 (three) times daily.  Dispense: 60 tablet; Refill: 2 - clonazePAM (KLONOPIN) 0.5 MG tablet; Take 1 tablet (0.5 mg total) by mouth 2 (two) times daily as needed for anxiety.  Dispense: 20 tablet; Refill: 0  I spent more than 50% of the total time of 34 minutes counseling patient.   Patient was given opportunity to ask questions. Patient verbalized understanding of the plan and was able to repeat key elements of the plan. All questions were answered to their satisfaction.   , FNP, have reviewed all documentation for this visit. The documentation on 12/11/19 for the exam, diagnosis, procedures, and orders are all accurate and complete.  THE PATIENT IS ENCOURAGED TO PRACTICE SOCIAL DISTANCING DUE TO THE COVID-19 PANDEMIC.

## 2019-12-08 ENCOUNTER — Encounter: Payer: Self-pay | Admitting: Allergy

## 2019-12-08 ENCOUNTER — Ambulatory Visit: Payer: BC Managed Care – PPO | Admitting: Allergy

## 2019-12-08 VITALS — BP 124/82 | HR 91 | Temp 98.2°F | Resp 16

## 2019-12-08 DIAGNOSIS — J3089 Other allergic rhinitis: Secondary | ICD-10-CM | POA: Diagnosis not present

## 2019-12-08 DIAGNOSIS — J310 Chronic rhinitis: Secondary | ICD-10-CM | POA: Diagnosis not present

## 2019-12-08 DIAGNOSIS — T781XXD Other adverse food reactions, not elsewhere classified, subsequent encounter: Secondary | ICD-10-CM | POA: Diagnosis not present

## 2019-12-08 DIAGNOSIS — J45990 Exercise induced bronchospasm: Secondary | ICD-10-CM

## 2019-12-08 MED ORDER — LEVOCETIRIZINE DIHYDROCHLORIDE 5 MG PO TABS: 5.0000 mg | ORAL_TABLET | Freq: Every evening | ORAL | 5 refills | Status: DC

## 2019-12-08 NOTE — Patient Instructions (Addendum)
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    Your milk serum IgE and cheese (moldy type) IgE were negative on previous blood testing and previous skin testing to milk was negative.     - your food allergy testing did show positive levels to nuts, shellfish, soybean, wheat, sesame seed.  You are able to eat these foods without symptoms thus you are most likely sensitized (see below).  Would keep in the diet to maintain tolerance.  - we have discussed the following in regards to foods:   Allergy: food allergy is when you have eaten a food, developed an allergic reaction after eating the food and have IgE to the food (positive food testing either by skin testing or blood testing).  Food allergy could lead to life threatening symptoms  Sensitivity: occurs when you have IgE to a food (positive food testing either by skin testing or blood testing) but is a food you eat without any issues.  This is not an allergy and we recommend keeping the food in the diet  Intolerance: this is when you have negative testing by either skin testing or blood testing thus not allergic but the food causes symptoms (like belly pain, bloating, diarrhea etc) with ingestion.  These foods should be avoided to prevent symptoms.      -Pepcid 20mg  twice a day to see if this will help prevent and management GI symptoms with ingestion    -use nasal Atrovent 2 sprays as needed up to 3-4 times a day if your going to eat cheese or other foods or situations where you develop nasal congestion and drainage.  You can use prior to eating cheese to help decrease nasal drainage or after you eat cheeses to help dry up the nasal drainage.    Exercise-induced asthma/Reactive airway disease   - continue Singulair 10mg  daily-- take at bedtime   - use Qvar 2 puffs twice a day    - have access to albuterol inhaler 2 puffs every 4-6 hours as needed for cough/wheeze/shortness of  breath/chest tightness.  May use 15-20 minutes prior to activity.   Monitor frequency of use.   Asthma control goals:   Full participation in all desired activities (may need albuterol before activity)  Albuterol use two time or less a week on average (not counting use with activity)  Cough interfering with sleep two time or less a month  Oral steroids no more than once a year  No hospitalizations  Allergic rhinitis    - continue Xyzal 5mg  daily    - as above use nasal Atrovent and for allergy symptom control use 2 sprays each nostril twice a day.      - continue allergen avoidance grasses, weeds, trees, mold (botrytis cinera), dust mites, dog, cockroach.  Would not recommend use of humidifier as this releases moisture into the air and dust mites live/thrive on moisture and will be more rampant in your environment.  Recommend an air purifier with HEPA filtration instead.   You can purchase smaller portable air filters for use in your work space.     Follow-up 6 months or sooner if needed

## 2019-12-08 NOTE — Progress Notes (Signed)
Follow-up Note  RE: Belinda Barton MRN: [id number] DOB: 1985-08-20 Date of Office Visit: 12/08/2019   History of present illness: Belinda Barton is a 34 y.o. female presenting today for follow-up of gustatory rhinitis/adverse food reaction, exercise-induced asthma and allergic rhinitis.  She was last seen in the office on Jun 22, 2019 by myself.  She states she has been doing well since her last visit without any major health changes, surgeries or hospitalizations. She states she has been more consistent with using her medications and can tell a difference.  She is using the Atrovent and states that this does help.  She states she does use it if she starts to have some nasal drainage and it dries it up.  She also continues to decrease her ingestion of dairy products as this does cause congestion.  She also continues to take famotidine which also helps with GI issues. In regards to her asthma she states she did use her albuterol recently when the weather changed from being warmer to colder temperature.  She continues to take montelukast as well as use her Qvar.  She has not had any systemic steroids or ED or urgent care visits. She does continue on levo cetirizine for allergy symptom control and as above using the Atrovent. She states that she is able to eat nuts without any issues or symptoms but does not do so regularly.  She loves shrimp and eats shrimp often without any significant issues.  She does eat at a mom a from a soy category but does not consume a lot of soy based products.  She does eat bread and wheat products without any issue.  Sesame seed she states she may eat it on bread if is on there but does not consume a lot of sesame based products in general.  She does have access to her epinephrine device in case of a reaction.   Review of systems: Review of Systems  Constitutional: Negative.   HENT: Negative.   Eyes: Negative.   Respiratory: Positive for shortness of breath.    Cardiovascular: Negative.   Gastrointestinal: Negative.   Musculoskeletal: Negative.   Skin: Negative.   Neurological: Negative.     All other systems negative unless noted above in HPI  Past medical/social/surgical/family history have been reviewed and are unchanged unless specifically indicated below.  No changes  Medication List: Current Outpatient Medications  Medication Sig Dispense Refill  . albuterol (VENTOLIN HFA) 108 (90 Base) MCG/ACT inhaler Inhale 2 puffs into the lungs every 6 (six) hours as needed for wheezing or shortness of breath. 18 g 1  . beclomethasone (QVAR REDIHALER) 80 MCG/ACT inhaler Inhale 2 puffs into the lungs 2 (two) times daily. 10.6 g 5  . busPIRone (BUSPAR) 15 MG tablet Take 1 tablet (15 mg total) by mouth 3 (three) times daily. 60 tablet 2  . clonazePAM (KLONOPIN) 0.5 MG tablet Take 1 tablet (0.5 mg total) by mouth 2 (two) times daily as needed for anxiety. 20 tablet 0  . famotidine (PEPCID) 20 MG tablet Take 1 tablet (20 mg total) by mouth 2 (two) times daily. 60 tablet 5  . gabapentin (NEURONTIN) 100 MG capsule Take 1 capsule (100 mg total) by mouth 3 (three) times daily as needed. 90 capsule 2  . ipratropium (ATROVENT) 0.06 % nasal spray Place 2 sprays into both nostrils 4 (four) times daily. 15 mL 5  . levocetirizine (XYZAL) 5 MG tablet Take 1 tablet (5 mg total) by mouth every evening. 30  tablet 5  . lisdexamfetamine (VYVANSE) 20 MG capsule Take 20 mg by mouth daily.    . montelukast (SINGULAIR) 10 MG tablet TAKE 1 TABLET BY MOUTH EVERY DAY AT BEDTIME 90 tablet 0  . valACYclovir (VALTREX) 1000 MG tablet Take 1,000 mg by mouth as needed.      No current facility-administered medications for this visit.     Known medication allergies: Allergies  Allergen Reactions  . Cheese Shortness Of Breath    Self diagnosed  . Penicillins      Physical examination: Blood pressure 124/82, pulse 91, temperature 98.2 F (36.8 C), temperature source  Temporal, resp. rate 16, SpO2 99 %.  General: Alert, interactive, in no acute distress. HEENT: PERRLA, TMs pearly gray, turbinates non-edematous without discharge, post-pharynx non erythematous. Neck: Supple without lymphadenopathy. Lungs: Clear to auscultation without wheezing, rhonchi or rales. {no increased work of breathing. CV: Normal S1, S2 without murmurs. Abdomen: Nondistended, nontender. Skin: Warm and dry, without lesions or rashes. Extremities:  No clubbing, cyanosis or edema. Neuro:   Grossly intact.  Diagnositics/Labs: Labs:  Component     Latest Ref Rng & Units 06/22/2019  Codfish IgE     Class 0 kU/L <0.10  Halibut IgE     Class 0 kU/L <0.10  Allergen [place] IgE     Class 0 kU/L <0.10  Tuna     Class 0 kU/L <0.10  Allergen Salmon IgE     Class 0 kU/L <0.10  Allergen Mackerel IgE     Class 0 kU/L <0.10  Allergen Trout IgE     Class 0 kU/L <0.10  Peanut IgE     Class II kU/L 0.86 (A)  Hazelnut ([place]) IgE     Class 0/I kU/L 0.25 (A)  [place] Nut IgE     Class 0 kU/L <0.10  F020-IgE Almond     Class 0/I kU/L 0.31 (A)  Pecan Nut IgE     Class 0 kU/L <0.10  F202-IgE Cashew Nut     Class 0 kU/L <0.10  Walnut IgE     Class I kU/L 0.33 (A)  Clam IgE     Class 0/I kU/L 0.16 (A)  F023-IgE Crab     Class 0/I kU/L 0.31 (A)  Shrimp IgE     Class I kU/L 0.43 (A)  Scallop IgE     Class 0 kU/L <0.10  F290-IgE Oyster     Class 0 kU/L <0.10  F080-IgE Lobster     Class I kU/L 0.44 (A)  Milk IgE     Class 0 kU/L <0.10  Soybean IgE     Class 0/I kU/L 0.20 (A)  Wheat IgE     Class II kU/L 0.62 (A)  Sesame Seed IgE     Class II kU/L 0.79 (A)  Egg White IgE     Class 0 kU/L <0.10  F078-IgE Casein     Class 0 kU/L <0.10    Assessment and plan: Adverse food reaction/GUSTATORY RHINITIS     - 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    Your milk serum IgE and cheese (moldy type) IgE were negative on previous blood testing and  previous skin testing to milk was negative.     - your food allergy testing did show positive levels to nuts, shellfish, soybean, wheat, sesame seed.  You are able to eat these foods without symptoms thus you are most likely sensitized (see below).  Would keep in the diet to maintain tolerance.  -  we have discussed the following in regards to foods:   Allergy: food allergy is when you have eaten a food, developed an allergic reaction after eating the food and have IgE to the food (positive food testing either by skin testing or blood testing).  Food allergy could lead to life threatening symptoms  Sensitivity: occurs when you have IgE to a food (positive food testing either by skin testing or blood testing) but is a food you eat without any issues.  This is not an allergy and we recommend keeping the food in the diet  Intolerance: this is when you have negative testing by either skin testing or blood testing thus not allergic but the food causes symptoms (like belly pain, bloating, diarrhea etc) with ingestion.  These foods should be avoided to prevent symptoms.      -Pepcid 20mg  twice a day to see if this will help prevent and management GI symptoms with ingestion    -use nasal Atrovent 2 sprays as needed up to 3-4 times a day if your going to eat cheese or other foods or situations where you develop nasal congestion and drainage.  You can use prior to eating cheese to help decrease nasal drainage or after you eat cheeses to help dry up the nasal drainage.    Exercise-induced asthma/Reactive airway disease   - continue Singulair 10mg  daily-- take at bedtime   - use Qvar 2 puffs twice a day    - have access to albuterol inhaler 2 puffs every 4-6 hours as needed for cough/wheeze/shortness of breath/chest tightness.  May use 15-20 minutes prior to activity.   Monitor frequency of use.   Asthma control goals:   Full participation in all desired activities (may need albuterol before  activity)  Albuterol use two time or less a week on average (not counting use with activity)  Cough interfering with sleep two time or less a month  Oral steroids no more than once a year  No hospitalizations  Allergic rhinitis    - continue Xyzal 5mg  daily    - as above use nasal Atrovent and for allergy symptom control use 2 sprays each nostril twice a day.      - continue allergen avoidance grasses, weeds, trees, mold (botrytis cinera), dust mites, dog, cockroach.  Would not recommend use of humidifier as this releases moisture into the air and dust mites live/thrive on moisture and will be more rampant in your environment.  Recommend an air purifier with HEPA filtration instead.   You can purchase smaller portable air filters for use in your work space.     Follow-up 6 months or sooner if needed  I appreciate the opportunity to take part in Jerriyah's care. Please do not hesitate to contact me with questions.  Sincerely,   , MD [facility]ergy/[facility]

## 2019-12-14 ENCOUNTER — Encounter: Payer: Self-pay | Admitting: Nurse Practitioner

## 2019-12-15 ENCOUNTER — Encounter: Payer: Self-pay | Admitting: Nurse Practitioner

## 2020-01-04 ENCOUNTER — Ambulatory Visit: Payer: BC Managed Care – PPO | Admitting: Nurse Practitioner

## 2020-02-22 ENCOUNTER — Encounter: Payer: Self-pay | Admitting: Nurse Practitioner

## 2020-02-22 ENCOUNTER — Telehealth (INDEPENDENT_AMBULATORY_CARE_PROVIDER_SITE_OTHER): Payer: BC Managed Care – PPO | Admitting: Nurse Practitioner

## 2020-02-22 DIAGNOSIS — J069 Acute upper respiratory infection, unspecified: Secondary | ICD-10-CM

## 2020-02-22 DIAGNOSIS — R110 Nausea: Secondary | ICD-10-CM

## 2020-02-22 DIAGNOSIS — U071 COVID-19: Secondary | ICD-10-CM

## 2020-02-22 DIAGNOSIS — R059 Cough, unspecified: Secondary | ICD-10-CM

## 2020-02-22 MED ORDER — ALBUTEROL SULFATE HFA 108 (90 BASE) MCG/ACT IN AERS: 2.0000 | INHALATION_SPRAY | Freq: Four times a day (QID) | RESPIRATORY_TRACT | 1 refills | Status: DC | PRN

## 2020-02-22 MED ORDER — AZITHROMYCIN 250 MG PO TABS: ORAL_TABLET | ORAL | 0 refills | Status: AC

## 2020-02-22 MED ORDER — ONDANSETRON HCL 4 MG PO TABS: 4.0000 mg | ORAL_TABLET | Freq: Every day | ORAL | 1 refills | Status: DC | PRN

## 2020-02-22 NOTE — Patient Instructions (Addendum)
Bland Diet A bland diet consists of foods that are often soft and do not have a lot of fat, fiber, or extra seasonings. Foods without fat, fiber, or seasoning are easier for the body to digest. They are also less likely to irritate your mouth, throat, stomach, and other parts of your digestive system. A bland diet is sometimes called a BRAT diet. What is my plan? Your health care provider or food and nutrition specialist (dietitian) may recommend specific changes to your diet to prevent symptoms or to treat your symptoms. These changes may include:  Eating small meals often.  Cooking food until it is soft enough to chew easily.  Chewing your food well.  Drinking fluids slowly.  Not eating foods that are very spicy, sour, or fatty.  Not eating citrus fruits, such as oranges and grapefruit. What do I need to know about this diet?  Eat a variety of foods from the bland diet food list.  Do not follow a bland diet longer than needed.  Ask your health care provider whether you should take vitamins or supplements. What foods can I eat? Grains Hot cereals, such as cream of wheat. Rice. Bread, crackers, or tortillas made from refined white flour.   Vegetables Canned or cooked vegetables. Mashed or boiled potatoes. Fruits Bananas. Applesauce. Other types of cooked or canned fruit with the skin and seeds removed, such as canned peaches or pears.   Meats and other proteins Scrambled eggs. Creamy peanut butter or other nut butters. Lean, well-cooked meats, such as chicken or fish. Tofu. Soups or broths.   Dairy Low-fat dairy products, such as milk, cottage cheese, or yogurt. Beverages Water. Herbal tea. Apple juice.   Fats and oils Mild salad dressings. Canola or olive oil. Sweets and desserts Pudding. Custard. Fruit gelatin. Ice cream. The items listed above may not be a complete list of recommended foods and beverages. Contact a dietitian for more options. What foods are not  recommended? Grains Whole grain breads and cereals. Vegetables Raw vegetables. Fruits Raw fruits, especially citrus, berries, or dried fruits. Dairy Whole fat dairy foods. Beverages Caffeinated drinks. Alcohol. Seasonings and condiments Strongly flavored seasonings or condiments. Hot sauce. Salsa. Other foods Spicy foods. Fried foods. Sour foods, such as pickled or fermented foods. Foods with high sugar content. Foods high in fiber. The items listed above may not be a complete list of foods and beverages to avoid. Contact a dietitian for more information. Summary  A bland diet consists of foods that are often soft and do not have a lot of fat, fiber, or extra seasonings.  Foods without fat, fiber, or seasoning are easier for the body to digest.  Check with your health care provider to see how long you should follow this diet plan. It is not meant to be followed for long periods. This information is not intended to replace advice given to you by your health care provider. Make sure you discuss any questions you have with your health care provider. Document Revised: 02/11/2017 Document Reviewed: 02/11/2017 Elsevier Patient Education  2021 Elsevier Inc.      Person Under Monitoring Name: Belinda Barton  Location: [address] [place] [address]   Infection Prevention Recommendations for Individuals Confirmed to have, or Being Evaluated for, 2019 Novel Coronavirus (COVID-19) Infection Who Receive Care at Home  Individuals who are confirmed to have, or are being evaluated for, COVID-19 should follow the prevention steps below until a healthcare provider or local or state health department says  they can return to normal activities.  Stay home except to get medical care You should restrict activities outside your home, except for getting medical care. Do not go to work, school, or public areas, and do not use public transportation or taxis.  Call ahead before  visiting your doctor Before your medical appointment, call the healthcare provider and tell them that you have, or are being evaluated for, COVID-19 infection. This will help the healthcare provider's office take steps to keep other people from getting infected. Ask your healthcare provider to call the local or state health department.  Monitor your symptoms Seek prompt medical attention if your illness is worsening (e.g., difficulty breathing). Before going to your medical appointment, call the healthcare provider and tell them that you have, or are being evaluated for, COVID-19 infection. Ask your healthcare provider to call the local or state health department.  Wear a facemask You should wear a facemask that covers your nose and mouth when you are in the same room with other people and when you visit a healthcare provider. People who live with or visit you should also wear a facemask while they are in the same room with you.  Separate yourself from other people in your home As much as possible, you should stay in a different room from other people in your home. Also, you should use a separate bathroom, if available.  Avoid sharing household items You should not share dishes, drinking glasses, cups, eating utensils, towels, bedding, or other items with other people in your home. After using these items, you should wash them thoroughly with soap and water.  Cover your coughs and sneezes Cover your mouth and nose with a tissue when you cough or sneeze, or you can cough or sneeze into your sleeve. Throw used tissues in a lined trash can, and immediately wash your hands with soap and water for at least 20 seconds or use an alcohol-based hand rub.  Wash your [employer] your hands often and thoroughly with soap and water for at least 20 seconds. You can use an alcohol-based hand sanitizer if soap and water are not available and if your hands are not visibly dirty. Avoid touching your eyes,  nose, and mouth with unwashed hands.   Prevention Steps for Caregivers and Household Members of Individuals Confirmed to have, or Being Evaluated for, COVID-19 Infection Being Cared for in the Home  If you live with, or provide care at home for, a person confirmed to have, or being evaluated for, COVID-19 infection please follow these guidelines to prevent infection:  Follow healthcare provider's instructions Make sure that you understand and can help the patient follow any healthcare provider instructions for all care.  Provide for the patient's basic needs You should help the patient with basic needs in the home and provide support for getting groceries, prescriptions, and other personal needs.  Monitor the patient's symptoms If they are getting sicker, call his or her medical provider and tell them that the patient has, or is being evaluated for, COVID-19 infection. This will help the healthcare provider's office take steps to keep other people from getting infected. Ask the healthcare provider to call the local or state health department.  Limit the number of people who have contact with the patient  If possible, have only one caregiver for the patient.  Other household members should stay in another home or place of residence. If this is not possible, they should stay  in another room, or be  separated from the patient as much as possible. Use a separate bathroom, if available.  Restrict visitors who do not have an essential need to be in the home.  Keep older adults, very young children, and other sick people away from the patient Keep older adults, very young children, and those who have compromised immune systems or chronic health conditions away from the patient. This includes people with chronic heart, lung, or kidney conditions, diabetes, and cancer.  Ensure good ventilation Make sure that shared spaces in the home have good air flow, such as from an air conditioner or  an opened window, weather permitting.  Wash your hands often  Wash your hands often and thoroughly with soap and water for at least 20 seconds. You can use an alcohol based hand sanitizer if soap and water are not available and if your hands are not visibly dirty.  Avoid touching your eyes, nose, and mouth with unwashed hands.  Use disposable paper towels to dry your hands. If not available, use dedicated cloth towels and replace them when they become wet.  Wear a facemask and gloves  Wear a disposable facemask at all times in the room and gloves when you touch or have contact with the patient's blood, body fluids, and/or secretions or excretions, such as sweat, saliva, sputum, nasal mucus, vomit, urine, or feces.  Ensure the mask fits over your nose and mouth tightly, and do not touch it during use.  Throw out disposable facemasks and gloves after using them. Do not reuse.  Wash your hands immediately after removing your facemask and gloves.  If your personal clothing becomes contaminated, carefully remove clothing and launder. Wash your hands after handling contaminated clothing.  Place all used disposable facemasks, gloves, and other waste in a lined container before disposing them with other household waste.  Remove gloves and wash your hands immediately after handling these items.  Do not share dishes, glasses, or other household items with the patient  Avoid sharing household items. You should not share dishes, drinking glasses, cups, eating utensils, towels, bedding, or other items with a patient who is confirmed to have, or being evaluated for, COVID-19 infection.  After the person uses these items, you should wash them thoroughly with soap and water.  Wash laundry thoroughly  Immediately remove and wash clothes or bedding that have blood, body fluids, and/or secretions or excretions, such as sweat, saliva, sputum, nasal mucus, vomit, urine, or feces, on them.  Wear gloves  when handling laundry from the patient.  Read and follow directions on labels of laundry or clothing items and detergent. In general, wash and dry with the warmest temperatures recommended on the label.  Clean all areas the individual has used often  Clean all touchable surfaces, such as counters, tabletops, doorknobs, bathroom fixtures, toilets, phones, keyboards, tablets, and bedside tables, every day. Also, clean any surfaces that may have blood, body fluids, and/or secretions or excretions on them.  Wear gloves when cleaning surfaces the patient has come in contact with.  Use a diluted bleach solution (e.g., dilute bleach with 1 part bleach and 10 parts water) or a household disinfectant with a label that says EPA-registered for coronaviruses. To make a bleach solution at home, add 1 tablespoon of bleach to 1 quart (4 cups) of water. For a larger supply, add  cup of bleach to 1 gallon (16 cups) of water.  Read labels of cleaning products and follow recommendations provided on product labels. Labels contain instructions for safe  and effective use of the cleaning product including precautions you should take when applying the product, such as wearing gloves or eye protection and making sure you have good ventilation during use of the product.  Remove gloves and wash hands immediately after cleaning.  Monitor yourself for signs and symptoms of illness Caregivers and household members are considered close contacts, should monitor their health, and will be asked to limit movement outside of the home to the extent possible. Follow the monitoring steps for close contacts listed on the symptom monitoring form.   ? If you have additional questions, contact your local health department or call the epidemiologist on call at [phone] (available 24/7). ? This guidance is subject to change. For the most up-to-date guidance from [facility], please refer to their  website: [email or web address]

## 2020-02-22 NOTE — Progress Notes (Signed)
Virtual Visit via MyChart - failed video   This visit type was conducted due to national recommendations for restrictions regarding the COVID-19 Pandemic (e.g. social distancing) in an effort to limit this patient's exposure and mitigate transmission in our community.  Due to her co-morbid illnesses, this patient is at least at moderate risk for complications without adequate follow up.  This format is felt to be most appropriate for this patient at this time.  All issues noted in this document were discussed and addressed.  A limited physical exam was performed with this format.    This visit type was conducted due to national recommendations for restrictions regarding the COVID-19 Pandemic (e.g. social distancing) in an effort to limit this patient's exposure and mitigate transmission in our community.  Patients identity confirmed using two different identifiers.  This format is felt to be most appropriate for this patient at this time.  All issues noted in this document were discussed and addressed.  No physical exam was performed (except for noted visual exam findings with Video Visits).    Date:  02/22/2020   ID:  Belinda Barton, DOB 04/01/1985, MRN [id number]  Patient Location:  Home - spoke with Belinda Barton  Provider location:   Office    Chief Complaint:  Positive covid  History of Present Illness:    Belinda Barton is a 35 y.o. female who presents via video conferencing for a telehealth visit today.    The patient does have symptoms concerning for COVID-19 infection (fever, chills, cough, or new shortness of breath).   Virtual visit due to testing positive for covid. She had diarrhea and vomiting for 2 days. Then she was okay for a couple days then she got sick again.  The symptom she has had in the last couple days she has been nauseated, anytime she eats she feels like her stomach is unsettling.  She has had a cough.  She is going from hot to cold sensation.  Started on  Sunday 16th.  She was also having lower back pain. Then 2 days later had body chills. She has not been vaccinated.  She has been taking Theraflu and pedialyte.     Past Medical History:  Diagnosis Date  . Anxiety   . Asthma   . Herpes genitalis in women   . Vitamin D deficiency    History reviewed. No pertinent surgical history.   Current Meds  Medication Sig  . azithromycin (ZITHROMAX) 250 MG tablet Take 2 tablets (500 mg) on  Day 1,  followed by 1 tablet (250 mg) once daily on Days 2 through 5.  . beclomethasone (QVAR REDIHALER) 80 MCG/ACT inhaler Inhale 2 puffs into the lungs 2 (two) times daily.  . busPIRone (BUSPAR) 15 MG tablet Take 1 tablet (15 mg total) by mouth 3 (three) times daily.  . famotidine (PEPCID) 20 MG tablet Take 1 tablet (20 mg total) by mouth 2 (two) times daily.  Marland Kitchen ipratropium (ATROVENT) 0.06 % nasal spray Place 2 sprays into both nostrils 4 (four) times daily.  Marland Kitchen levocetirizine (XYZAL) 5 MG tablet Take 1 tablet (5 mg total) by mouth every evening.  . montelukast (SINGULAIR) 10 MG tablet TAKE 1 TABLET BY MOUTH EVERY DAY AT BEDTIME  . ondansetron (ZOFRAN) 4 MG tablet Take 1 tablet (4 mg total) by mouth daily as needed for nausea or vomiting.  . valACYclovir (VALTREX) 1000 MG tablet Take 1,000 mg by mouth as needed.   . [DISCONTINUED] albuterol (VENTOLIN HFA) 108 (90  Base) MCG/ACT inhaler Inhale 2 puffs into the lungs every 6 (six) hours as needed for wheezing or shortness of breath.     Allergies:   Cheese and Penicillins   Social History   Tobacco Use  . Smoking status: Former Games developer  . Smokeless tobacco: Never Used  Vaping Use  . Vaping Use: Never used  Substance Use Topics  . Alcohol use: Yes  . Drug use: No     Family Hx: The patient's family history includes Asthma in her brother; Diabetes in her mother.  ROS:   Please see the history of present illness.    Review of Systems  Constitutional: Positive for chills.  Respiratory: Positive for  cough, sputum production and wheezing. Negative for shortness of breath.   Cardiovascular: Negative.   Neurological: Negative for dizziness and headaches.  Psychiatric/Behavioral: Negative.     All other systems reviewed and are negative.   Labs/Other Tests and Data Reviewed:    Recent Labs: 05/11/2019: ALT 19; BUN 10; Creatinine, Ser 1.01; Hemoglobin 14.5; Platelets 349; Potassium 4.2; Sodium 141   Recent Lipid Panel Lab Results  Component Value Date/Time   CHOL 169 05/11/2019 05:03 PM   TRIG 82 05/11/2019 05:03 PM   HDL 47 05/11/2019 05:03 PM   CHOLHDL 3.6 05/11/2019 05:03 PM   LDLCALC 107 (H) 05/11/2019 05:03 PM    Wt Readings from Last 3 Encounters:  12/07/19 245 lb 12.8 oz (111.5 kg)  05/11/19 245 lb (111.1 kg)  02/16/19 243 lb (110.2 kg)     Exam:    Vital Signs:  LMP 02/17/2020     Physical Exam Constitutional:      General: She is not in acute distress.    Appearance: She is obese.  Cardiovascular:     Heart sounds: Normal heart sounds. No murmur heard.   Neurological:     Mental Status: She is oriented to person, place, and time.     Cranial Nerves: No cranial nerve deficit.  Psychiatric:        Mood and Affect: Mood normal.        Behavior: Behavior normal.        Thought Content: Thought content normal.        Judgment: Judgment normal.     ASSESSMENT & PLAN:    1. Nausea  - ondansetron (ZOFRAN) 4 MG tablet; Take 1 tablet (4 mg total) by mouth daily as needed for nausea or vomiting.  Dispense: 30 tablet; Refill: 1  2. Cough She has had a positive covid test She it to take over the counter cough syrup Albuterol inhaler sent in as well for the hacking cough  3. Upper respiratory tract infection, unspecified type  - azithromycin (ZITHROMAX) 250 MG tablet; Take 2 tablets (500 mg) on  Day 1,  followed by 1 tablet (250 mg) once daily on Days 2 through 5.  Dispense: 6 each; Refill: 0  4. Lab test positive for detection of COVID-19 virus Symptoms  started on 1/16 she is to remain out of work until 1/27 If has shortness of breath or sudden onset chest pain go to ER for evaluation She is encouraged to take vitamin d, zinc, and vitamin c regularly    COVID-19 Education: The signs and symptoms of COVID-19 were discussed with the patient and how to seek care for testing (follow up with PCP or arrange E-visit).  The importance of social distancing was discussed today.  Patient Risk:   After full review of this patients  clinical status, I feel that they are at least moderate risk at this time.  Time:   Today, I have spent 15 minutes/ seconds with the patient with telehealth technology discussing above diagnoses.     Medication Adjustments/Labs and Tests Ordered: Current medicines are reviewed at length with the patient today.  Concerns regarding medicines are outlined above.   Tests Ordered: No orders of the defined types were placed in this encounter.   Medication Changes: Meds ordered this encounter  Medications  . ondansetron (ZOFRAN) 4 MG tablet    Sig: Take 1 tablet (4 mg total) by mouth daily as needed for nausea or vomiting.    Dispense:  30 tablet    Refill:  1  . albuterol (VENTOLIN HFA) 108 (90 Base) MCG/ACT inhaler    Sig: Inhale 2 puffs into the lungs every 6 (six) hours as needed for wheezing or shortness of breath.    Dispense:  18 g    Refill:  1  . azithromycin (ZITHROMAX) 250 MG tablet    Sig: Take 2 tablets (500 mg) on  Day 1,  followed by 1 tablet (250 mg) once daily on Days 2 through 5.    Dispense:  6 each    Refill:  0    Disposition:  Follow up prn  Signed, Arnette Felts, FNP

## 2020-05-15 ENCOUNTER — Other Ambulatory Visit: Payer: Self-pay

## 2020-05-15 ENCOUNTER — Ambulatory Visit (INDEPENDENT_AMBULATORY_CARE_PROVIDER_SITE_OTHER): Payer: BC Managed Care – PPO | Admitting: Nurse Practitioner

## 2020-05-15 ENCOUNTER — Encounter: Payer: Self-pay | Admitting: Nurse Practitioner

## 2020-05-15 VITALS — BP 118/84 | HR 86 | Temp 98.3°F | Ht 63.2 in | Wt 240.0 lb

## 2020-05-15 DIAGNOSIS — E559 Vitamin D deficiency, unspecified: Secondary | ICD-10-CM | POA: Diagnosis not present

## 2020-05-15 DIAGNOSIS — Z6841 Body Mass Index (BMI) 40.0 and over, adult: Secondary | ICD-10-CM

## 2020-05-15 DIAGNOSIS — Z0000 Encounter for general adult medical examination without abnormal findings: Secondary | ICD-10-CM | POA: Diagnosis not present

## 2020-05-15 DIAGNOSIS — E7800 Pure hypercholesterolemia, unspecified: Secondary | ICD-10-CM

## 2020-05-15 DIAGNOSIS — F419 Anxiety disorder, unspecified: Secondary | ICD-10-CM | POA: Diagnosis not present

## 2020-05-15 DIAGNOSIS — G4700 Insomnia, unspecified: Secondary | ICD-10-CM

## 2020-05-15 MED ORDER — BUSPIRONE HCL 15 MG PO TABS: 15.0000 mg | ORAL_TABLET | Freq: Two times a day (BID) | ORAL | 1 refills | Status: DC

## 2020-05-15 NOTE — Progress Notes (Signed)
Rutherford Nail as a scribe for Minette Brine, FNP.,have documented all relevant documentation on the behalf of Minette Brine, FNP,as directed by  Minette Brine, FNP while in the presence of Minette Brine, [place]. This visit occurred during the SARS-CoV-2 public health emergency.  Safety protocols were in place, including screening questions prior to the visit, additional usage of staff PPE, and extensive cleaning of exam room while observing appropriate contact time as indicated for disinfecting solutions.  Subjective:     Patient ID: Belinda Barton , female    DOB: 1986/01/20 , 35 y.o.   MRN: [id number]   Chief Complaint  Patient presents with  . Annual Exam    HPI  [facility]  Wt Readings from Last 3 Encounters: 05/15/20 : 240 lb (108.9 kg) 12/07/19 : 245 lb 12.8 oz (111.5 kg) 05/11/19 : 245 lb (111.1 kg)     Past Medical History:  Diagnosis Date  . Anxiety   . Asthma   . Herpes genitalis in women   . Vitamin D deficiency      Family History  Problem Relation Age of Onset  . Diabetes Mother   . Asthma Brother      Current Outpatient Medications:  .  albuterol (VENTOLIN HFA) 108 (90 Base) MCG/ACT inhaler, Inhale 2 puffs into the lungs every 6 (six) hours as needed for wheezing or shortness of breath., Disp: 18 g, Rfl: 1 .  beclomethasone (QVAR REDIHALER) 80 MCG/ACT inhaler, Inhale 2 puffs into the lungs 2 (two) times daily., Disp: 10.6 g, Rfl: 5 .  famotidine (PEPCID) 20 MG tablet, Take 1 tablet (20 mg total) by mouth 2 (two) times daily., Disp: 60 tablet, Rfl: 5 .  ipratropium (ATROVENT) 0.06 % nasal spray, Place 2 sprays into both nostrils 4 (four) times daily., Disp: 15 mL, Rfl: 5 .  levocetirizine (XYZAL) 5 MG tablet, Take 1 tablet (5 mg total) by mouth every evening., Disp: 30 tablet, Rfl: 5 .  montelukast (SINGULAIR) 10 MG tablet, TAKE 1 TABLET BY MOUTH EVERY DAY AT BEDTIME, Disp: 90 tablet, Rfl: 0 .   busPIRone (BUSPAR) 15 MG tablet, Take 1 tablet (15 mg total) by mouth 2 (two) times daily., Disp: 180 tablet, Rfl: 1 .  valACYclovir (VALTREX) 1000 MG tablet, Take 1,000 mg by mouth as needed.  (Patient not taking: Reported on 05/15/2020), Disp: , Rfl:    Allergies  Allergen Reactions  . Cheese Shortness Of Breath    Self diagnosed  . Penicillins       The patient states she uses none for birth control.  Patient's last menstrual period was 04/15/2020.. Negative for Dysmenorrhea and Negative for Menorrhagia. Negative for: breast discharge, breast lump(s), breast pain and breast self exam. Associated symptoms include abnormal vaginal bleeding. Pertinent negatives include abnormal bleeding (hematology), anxiety, decreased libido, depression, difficulty falling sleep, dyspareunia, history of infertility, nocturia, sexual dysfunction, sleep disturbances, urinary incontinence, urinary urgency, vaginal discharge and vaginal itching. Diet regular. The patient states her exercise level is minimal.   The patient's tobacco use is:  Social History   Tobacco Use  Smoking Status Former Smoker  Smokeless Tobacco Never Used   She has been exposed to passive smoke. The patient's alcohol use is:  Social History   Substance and Sexual Activity  Alcohol Use Yes   Additional information: Last pap 01/12/2019, will call [facility]    Review of Systems  Constitutional: Negative.  Negative for fatigue.  HENT: Negative.   Eyes: Negative.   Respiratory: Negative.   Cardiovascular: Negative.  Negative for chest pain, palpitations and leg swelling.  Gastrointestinal: Negative.   Endocrine: Negative for polydipsia, polyphagia and polyuria.  Genitourinary: Negative.   Musculoskeletal: Negative.   Skin: Negative.   Neurological: Negative for dizziness and headaches.  Psychiatric/Behavioral: Negative.      Today's Vitals   05/15/20 1546  BP: 118/84  Pulse: 86  Temp: 98.3 F (36.8  C)  TempSrc: Oral  SpO2: 99%  Weight: 240 lb (108.9 kg)  Height: 5' 3.2" (1.605 m)   Body mass index is 42.25 kg/m.  Wt Readings from Last 3 Encounters:  05/15/20 240 lb (108.9 kg)  12/07/19 245 lb 12.8 oz (111.5 kg)  05/11/19 245 lb (111.1 kg)   Objective:  Physical Exam Constitutional:      General: She is not in acute distress.    Appearance: Normal appearance. She is well-developed. She is obese.  HENT:     Head: Normocephalic and atraumatic.     Right Ear: Hearing, tympanic membrane, ear canal and external ear normal. There is no impacted cerumen.     Left Ear: Hearing, tympanic membrane, ear canal and external ear normal. There is no impacted cerumen.     Nose:     Comments: Deferred - mask     Mouth/Throat:     Comments: Deferred - mask Eyes:     General: Lids are normal.     Extraocular Movements: Extraocular movements intact.     Conjunctiva/sclera: Conjunctivae normal.     Pupils: Pupils are equal, round, and reactive to light.     Funduscopic exam:    Right eye: No papilledema.        Left eye: No papilledema.  Neck:     Thyroid: No thyroid mass.     Vascular: No carotid bruit.  Cardiovascular:     Rate and Rhythm: Normal rate and regular rhythm.     Pulses: Normal pulses.     Heart sounds: Normal heart sounds. No murmur heard.   Pulmonary:     Effort: Pulmonary effort is normal. No respiratory distress.     Breath sounds: Normal breath sounds. No wheezing.  Abdominal:     General: Abdomen is flat. Bowel sounds are normal.     Palpations: Abdomen is soft.  Musculoskeletal:        General: No swelling. Normal range of motion.     Cervical back: Full passive range of motion without pain, normal range of motion and neck supple.     Right lower leg: No edema.     Left lower leg: No edema.  Skin:    General: Skin is warm and dry.     Capillary Refill: Capillary refill takes less than 2 seconds.  Neurological:     General: No focal deficit present.      Mental Status: She is alert and oriented to person, place, and time.     Cranial Nerves: No cranial nerve deficit.     Sensory: No sensory deficit.  Psychiatric:        Mood and Affect: Mood normal.        Behavior: Behavior normal.        Thought Content: Thought content normal.        Judgment: Judgment normal.         Assessment And Plan:     1. Health maintenance examination . Behavior modifications discussed and  diet history reviewed.   . Pt will continue to exercise regularly and modify diet with low GI, plant based foods and decrease intake of processed foods.  . Recommend intake of daily multivitamin, Vitamin D, and calcium.  . Recommend mammogram and colonoscopy for preventive screenings, as well as recommend immunizations that include influenza, TDAP, and Shingles - CBC  2. Class 3 severe obesity due to excess calories without serious comorbidity with body mass index (BMI) of 42.0 to 44.9 in adult [facility])  Chronic  Discussed healthy diet and regular exercise options   Encouraged to exercise at least 150 minutes per week with 2 days of strength training  She is encouraged to strive for BMI less than 30 to decrease cardiac risk.   3. Anxiety  She is tolerating buspirone well - CMP14+EGFR - busPIRone (BUSPAR) 15 MG tablet; Take 1 tablet (15 mg total) by mouth 2 (two) times daily.  Dispense: 180 tablet; Refill: 1  4. Vitamin D deficiency  Will check vitamin D level and supplement as needed.     Also encouraged to spend 15 minutes in the sun daily.  - VITAMIN D 25 Hydroxy (Vit-D Deficiency, Fractures)  5. Insomnia, unspecified type  Chronic, encouraged to take melatonin as needed  6. Elevated LDL cholesterol level  Chronic, stable  Diet controlled - Lipid panel   She feels like she had her tdap in 2014.   Patient was given opportunity to ask questions. Patient verbalized understanding of the plan and was able to repeat key elements of the plan. All  questions were answered to their satisfaction.   Minette Brine, FNP  I, Minette Brine, FNP, have reviewed all documentation for this visit. The documentation on 05/15/20 for the exam, diagnosis, procedures, and orders are all accurate and complete.  THE PATIENT IS ENCOURAGED TO PRACTICE SOCIAL DISTANCING DUE TO THE COVID-19 PANDEMIC.

## 2020-05-17 ENCOUNTER — Other Ambulatory Visit: Payer: BC Managed Care – PPO

## 2020-05-17 ENCOUNTER — Other Ambulatory Visit: Payer: Self-pay

## 2020-05-18 LAB — CBC
Hematocrit: 42.4 % (ref 34.0–46.6)
Hemoglobin: 14 g/dL (ref 11.1–15.9)
MCH: 29.4 pg (ref 26.6–33.0)
MCHC: 33 g/dL (ref 31.5–35.7)
MCV: 89 fL (ref 79–97)
Platelets: 316 10*3/uL (ref 150–450)
RBC: 4.77 x10E6/uL (ref 3.77–5.28)
RDW: 13.2 % (ref 11.7–15.4)
WBC: 7.3 10*3/uL (ref 3.4–10.8)

## 2020-05-18 LAB — LIPID PANEL
Chol/HDL Ratio: 4 ratio (ref 0.0–4.4)
Cholesterol, Total: 169 mg/dL (ref 100–199)
HDL: 42 mg/dL (ref >39)
LDL Chol Calc (NIH): 108 mg/dL — ABNORMAL HIGH (ref 0–99)
Triglycerides: 101 mg/dL (ref 0–149)
VLDL Cholesterol Cal: 19 mg/dL (ref 5–40)

## 2020-05-18 LAB — CMP14+EGFR
ALT: 17 IU/L (ref 0–32)
AST: 20 IU/L (ref 0–40)
Albumin/Globulin Ratio: 1.5 (ref 1.2–2.2)
Albumin: 4.1 g/dL (ref 3.8–4.8)
Alkaline Phosphatase: 62 IU/L (ref 44–121)
BUN/Creatinine Ratio: 8 — ABNORMAL LOW (ref 9–23)
BUN: 8 mg/dL (ref 6–20)
Bilirubin Total: 0.6 mg/dL (ref 0.0–1.2)
CO2: 23 mmol/L (ref 20–29)
Calcium: 8.7 mg/dL (ref 8.7–10.2)
Chloride: 104 mmol/L (ref 96–106)
Creatinine, Ser: 1 mg/dL (ref 0.57–1.00)
Globulin, Total: 2.7 g/dL (ref 1.5–4.5)
Glucose: 96 mg/dL (ref 65–99)
Potassium: 4.3 mmol/L (ref 3.5–5.2)
Sodium: 142 mmol/L (ref 134–144)
Total Protein: 6.8 g/dL (ref 6.0–8.5)
eGFR: 76 mL/min/{1.73_m2} (ref >59)

## 2020-05-18 LAB — VITAMIN D 25 HYDROXY (VIT D DEFICIENCY, FRACTURES): Vit D, 25-Hydroxy: 8.5 ng/mL — ABNORMAL LOW (ref 30.0–100.0)

## 2020-06-01 ENCOUNTER — Other Ambulatory Visit: Payer: Self-pay | Admitting: Nurse Practitioner

## 2020-06-07 ENCOUNTER — Ambulatory Visit: Payer: BC Managed Care – PPO | Admitting: Allergy

## 2020-06-08 ENCOUNTER — Ambulatory Visit: Payer: BC Managed Care – PPO | Admitting: Allergy

## 2020-06-08 ENCOUNTER — Encounter: Payer: Self-pay | Admitting: Allergy

## 2020-06-08 ENCOUNTER — Other Ambulatory Visit: Payer: Self-pay

## 2020-06-08 VITALS — BP 120/78 | HR 81 | Temp 97.8°F | Resp 16 | Ht 63.0 in | Wt 273.0 lb

## 2020-06-08 DIAGNOSIS — J45990 Exercise induced bronchospasm: Secondary | ICD-10-CM | POA: Diagnosis not present

## 2020-06-08 DIAGNOSIS — J310 Chronic rhinitis: Secondary | ICD-10-CM

## 2020-06-08 DIAGNOSIS — J3089 Other allergic rhinitis: Secondary | ICD-10-CM

## 2020-06-08 DIAGNOSIS — T781XXD Other adverse food reactions, not elsewhere classified, subsequent encounter: Secondary | ICD-10-CM

## 2020-06-08 MED ORDER — MONTELUKAST SODIUM 10 MG PO TABS: ORAL_TABLET | ORAL | 1 refills | Status: DC

## 2020-06-08 MED ORDER — IPRATROPIUM BROMIDE 0.06 % NA SOLN: 2.0000 | Freq: Four times a day (QID) | NASAL | 5 refills | Status: DC

## 2020-06-08 MED ORDER — ALBUTEROL SULFATE HFA 108 (90 BASE) MCG/ACT IN AERS: 2.0000 | INHALATION_SPRAY | Freq: Four times a day (QID) | RESPIRATORY_TRACT | 1 refills | Status: DC | PRN

## 2020-06-08 MED ORDER — FAMOTIDINE 20 MG PO TABS: 20.0000 mg | ORAL_TABLET | Freq: Two times a day (BID) | ORAL | 5 refills | Status: DC

## 2020-06-08 MED ORDER — QVAR REDIHALER 80 MCG/ACT IN AERB: 2.0000 | INHALATION_SPRAY | Freq: Two times a day (BID) | RESPIRATORY_TRACT | 1 refills | Status: DC

## 2020-06-08 MED ORDER — LEVOCETIRIZINE DIHYDROCHLORIDE 5 MG PO TABS: 5.0000 mg | ORAL_TABLET | Freq: Every evening | ORAL | 5 refills | Status: DC

## 2020-06-08 NOTE — Progress Notes (Signed)
Follow-up Note  RE: Belinda Barton MRN: [id number] DOB: [birth year] Date of Office Visit: 06/08/2020   History of present illness: Belinda Barton is a 35 y.o. female presenting today for follow-up of adverse food reaction/gustatory rhinitis, reactive airway, allergic rhinitis.  She was last seen in the office on 12/08/19 by myself.   She is taking xyzal and does feel it still works well with the singulair daily and nasal atrovent in AM and in PM.  She states she should start taking a midday does of atrovent as she does mouth some drainage at that point.  She states as long as she is consistent with doing these medications she mostly has control of her allergy symptoms.  She has used albuterol once since last visit when the temps changed from hot to cool and reports had difficulty breathing with that.  She does continue to use Qvar 2 puffs twice a day.  Otherwise has not had any ED or urgent care visits or any systemic steroid needs.  She also still takes famotodine twice a day and can tell when she does not take and will note reflux symptoms.  She has identified that tomato based products definitely trigger reflux.    She has been trying to eat healthy and has noted sometimes she has a less desire to eat certain foods.  She states she has made an effort to decrease her intake of fried and fatty foods.  Sometimes when she has the option to eat say fried chicken tenders and she does not want to eat them also desires not there.   Review of systems: Review of Systems  Constitutional: Negative.   HENT:       See HPI  Eyes: Negative.   Respiratory: Negative.   Cardiovascular: Negative.   Gastrointestinal: Negative.   Musculoskeletal: Negative.   Skin: Negative.   Neurological: Negative.     All other systems negative unless noted above in HPI  Past medical/social/surgical/family history have been reviewed and are unchanged unless specifically indicated below.  No  changes  Medication List: Current Outpatient Medications  Medication Sig Dispense Refill  . busPIRone (BUSPAR) 15 MG tablet Take 1 tablet (15 mg total) by mouth 2 (two) times daily. 180 tablet 1  . valACYclovir (VALTREX) 1000 MG tablet Take 1,000 mg by mouth as needed.    . Vitamin D, Ergocalciferol, (DRISDOL) 1.25 MG (50000 UNIT) CAPS capsule TAKE 1CAPSULE BY MOUTH WEEKLY 24 capsule 0  . albuterol (VENTOLIN HFA) 108 (90 Base) MCG/ACT inhaler Inhale 2 puffs into the lungs every 6 (six) hours as needed for wheezing or shortness of breath. 18 g 1  . beclomethasone (QVAR REDIHALER) 80 MCG/ACT inhaler Inhale 2 puffs into the lungs 2 (two) times daily. 10.6 g 1  . famotidine (PEPCID) 20 MG tablet Take 1 tablet (20 mg total) by mouth 2 (two) times daily. 60 tablet 5  . ipratropium (ATROVENT) 0.06 % nasal spray Place 2 sprays into both nostrils 4 (four) times daily. 15 mL 5  . levocetirizine (XYZAL) 5 MG tablet Take 1 tablet (5 mg total) by mouth every evening. 30 tablet 5  . montelukast (SINGULAIR) 10 MG tablet TAKE 1 TABLET BY MOUTH EVERY DAY AT BEDTIME 90 tablet 1   No current facility-administered medications for this visit.     Known medication allergies: Allergies  Allergen Reactions  . Cheese Shortness Of Breath    Self diagnosed  . Penicillins      Physical examination: Blood pressure 120/78, pulse  81, temperature 97.8 F (36.6 C), resp. rate 16, height 5\' 3"  (1.6 m), weight 273 lb (123.8 kg), SpO2 97 %.  General: Alert, interactive, in no acute distress. HEENT: PERRLA, TMs pearly gray, turbinates moderately edematous with clear discharge, post-pharynx non erythematous. Neck: Supple without lymphadenopathy. Lungs: Clear to auscultation without wheezing, rhonchi or rales. {no increased work of breathing. CV: Normal S1, S2 without murmurs. Abdomen: Nondistended, nontender. Skin: Warm and dry, without lesions or rashes. Extremities:  No clubbing, cyanosis or edema. Neuro:    Grossly intact.  Diagnositics/Labs: Spirometry: FEV1: 2.33L 90%, FVC: 3.22L 105%, ratio consistent with nonobstructive pattern  Assessment and plan:   Adverse food reaction/GUSTATORY RHINITIS     - nasal congestion with food ingestion is gustatory rhinitis.    Your milk serum IgE and cheese (moldy type) IgE were negative on previous blood testing and previous skin testing to milk was negative.     - your food allergy testing did show positive levels to nuts, shellfish, soybean, wheat, sesame seed.  You are able to eat these foods without symptoms thus you are most likely sensitized (see below).  Would keep in the diet to maintain tolerance.  - we have discussed the following in regards to foods:   Allergy: food allergy is when you have eaten a food, developed an allergic reaction after eating the food and have IgE to the food (positive food testing either by skin testing or blood testing).  Food allergy could lead to life threatening symptoms  Sensitivity: occurs when you have IgE to a food (positive food testing either by skin testing or blood testing) but is a food you eat without any issues.  This is not an allergy and we recommend keeping the food in the diet  Intolerance: this is when you have negative testing by either skin testing or blood testing thus not allergic but the food causes symptoms (like belly pain, bloating, diarrhea etc) with ingestion.  These foods should be avoided to prevent symptoms.      -Pepcid 20mg  twice a day to see if this will help prevent and management GI symptoms with ingestion    -use nasal Atrovent 2 sprays as needed up to 3-4 times a day if your going to eat cheese or other foods or situations where you develop nasal congestion and drainage.  You can use prior to eating cheese to help decrease nasal drainage or after you eat cheeses to help dry up the nasal drainage.    Exercise-induced asthma/Reactive airway disease   - lung function looks great today!   -  continue Singulair 10mg  daily-- take at bedtime   - use Qvar 2 puffs twice a day    - have access to albuterol inhaler 2 puffs every 4-6 hours as needed for cough/wheeze/shortness of breath/chest tightness.  May use 15-20 minutes prior to activity.   Monitor frequency of use.   Asthma control goals:   Full participation in all desired activities (may need albuterol before activity)  Albuterol use two time or less a week on average (not counting use with activity)  Cough interfering with sleep two time or less a month  Oral steroids no more than once a year  No hospitalizations  Allergic rhinitis    - continue Xyzal (levocetirizine) 5mg  daily    - as above use nasal Atrovent and for allergy symptom control use 2 sprays each nostril twice a day (may use up to 4 times a day if needed for nasal  drainage/congestion control)    - continue allergen avoidance grasses, weeds, trees, mold (botrytis cinera), dust mites, dog, cockroach.   Follow-up 6 months or sooner if needed  I appreciate the opportunity to take part in Jaleeah's care. Please do not hesitate to contact me with questions.  Sincerely,   Margo Aye, MD Allergy/Immunology [facility]

## 2020-06-08 NOTE — Patient Instructions (Addendum)
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    Your milk serum IgE and cheese (moldy type) IgE were negative on previous blood testing and previous skin testing to milk was negative.     - your food allergy testing did show positive levels to nuts, shellfish, soybean, wheat, sesame seed.  You are able to eat these foods without symptoms thus you are most likely sensitized (see below).  Would keep in the diet to maintain tolerance.  - we have discussed the following in regards to foods:   Allergy: food allergy is when you have eaten a food, developed an allergic reaction after eating the food and have IgE to the food (positive food testing either by skin testing or blood testing).  Food allergy could lead to life threatening symptoms  Sensitivity: occurs when you have IgE to a food (positive food testing either by skin testing or blood testing) but is a food you eat without any issues.  This is not an allergy and we recommend keeping the food in the diet  Intolerance: this is when you have negative testing by either skin testing or blood testing thus not allergic but the food causes symptoms (like belly pain, bloating, diarrhea etc) with ingestion.  These foods should be avoided to prevent symptoms.      -Pepcid 20mg  twice a day to see if this will help prevent and management GI symptoms with ingestion    -use nasal Atrovent 2 sprays as needed up to 3-4 times a day if your going to eat cheese or other foods or situations where you develop nasal congestion and drainage.  You can use prior to eating cheese to help decrease nasal drainage or after you eat cheeses to help dry up the nasal drainage.    Exercise-induced asthma/Reactive airway disease   - lung function looks great today!   - continue Singulair 10mg  daily-- take at bedtime   - use Qvar 2 puffs twice a day    - have access to albuterol inhaler 2 puffs every 4-6 hours as needed  for cough/wheeze/shortness of breath/chest tightness.  May use 15-20 minutes prior to activity.   Monitor frequency of use.   Asthma control goals:   Full participation in all desired activities (may need albuterol before activity)  Albuterol use two time or less a week on average (not counting use with activity)  Cough interfering with sleep two time or less a month  Oral steroids no more than once a year  No hospitalizations  Allergic rhinitis    - continue Xyzal (levocetirizine) 5mg  daily    - as above use nasal Atrovent and for allergy symptom control use 2 sprays each nostril twice a day (may use up to 4 times a day if needed for nasal drainage/congestion control)    - continue allergen avoidance grasses, weeds, trees, mold (botrytis cinera), dust mites, dog, cockroach.   Follow-up 6 months or sooner if needed

## 2020-08-22 ENCOUNTER — Other Ambulatory Visit: Payer: Self-pay | Admitting: Allergy

## 2020-08-22 DIAGNOSIS — J3089 Other allergic rhinitis: Secondary | ICD-10-CM

## 2020-09-11 ENCOUNTER — Encounter: Payer: Self-pay | Admitting: Nurse Practitioner

## 2020-11-14 ENCOUNTER — Ambulatory Visit: Payer: BC Managed Care – PPO | Admitting: Nurse Practitioner

## 2020-12-14 ENCOUNTER — Ambulatory Visit: Payer: BC Managed Care – PPO | Admitting: Allergy

## 2021-01-11 ENCOUNTER — Telehealth: Payer: Self-pay | Admitting: Allergy

## 2021-01-29 ENCOUNTER — Other Ambulatory Visit: Payer: Self-pay | Admitting: Allergy

## 2021-01-29 MED ORDER — MONTELUKAST SODIUM 10 MG PO TABS: ORAL_TABLET | ORAL | 0 refills | Status: DC

## 2021-01-29 NOTE — Addendum Note (Signed)
Addended by: Herbie Drape on: 01/29/2021 12:16 PM   Modules accepted: Orders

## 2021-01-29 NOTE — Telephone Encounter (Signed)
Patient was previously scheduled for a follow up on 02/20/2021. She is wondering why she can not have this medication filled.  Please Advise.

## 2021-01-29 NOTE — Telephone Encounter (Signed)
Patient was last seen 06/08/2020 and was to return in 6 months. Prescription was denied due to it being past the 6 month mark and it is requesting 90 day supply. I spoke with patient and informed her that I could send in a courtesy 30 day supply until her appointment on 02/20/2021. I did inform patient that she will need to keep her appointment for further refills.

## 2021-02-12 ENCOUNTER — Other Ambulatory Visit: Payer: Self-pay

## 2021-02-12 ENCOUNTER — Ambulatory Visit (INDEPENDENT_AMBULATORY_CARE_PROVIDER_SITE_OTHER): Payer: BC Managed Care – PPO

## 2021-02-12 VITALS — BP 128/70 | HR 80 | Temp 98.5°F | Wt 245.0 lb

## 2021-02-12 DIAGNOSIS — Z23 Encounter for immunization: Secondary | ICD-10-CM | POA: Diagnosis not present

## 2021-02-12 NOTE — Patient Instructions (Signed)
Tdap (Tetanus, Diphtheria, Pertussis) Vaccine: What You Need to Know 1. Why get vaccinated? Tdap vaccine can prevent tetanus, diphtheria, and pertussis. Diphtheria and pertussis spread from person to person. Tetanus enters the body through cuts or wounds. TETANUS (T) causes painful stiffening of the muscles. Tetanus can lead to serious health problems, including being unable to open the mouth, having trouble swallowing and breathing, or death. DIPHTHERIA (D) can lead to difficulty breathing, heart failure, paralysis, or death. PERTUSSIS (aP), also known as "whooping cough," can cause uncontrollable, violent coughing that makes it hard to breathe, eat, or drink. Pertussis can be extremely serious especially in babies and young children, causing pneumonia, convulsions, brain damage, or death. In teens and adults, it can cause weight loss, loss of bladder control, passing out, and rib fractures from severe coughing. 2. Tdap vaccine Tdap is only for children 7 years and older, adolescents, and adults.  Adolescents should receive a single dose of Tdap, preferably at age 11 or 12 years. Pregnant people should get a dose of Tdap during every pregnancy, preferably during the early part of the third trimester, to help protect the newborn from pertussis. Infants are most at risk for severe, life-threatening complications frompertussis. Adults who have never received Tdap should get a dose of Tdap. Also, adults should receive a booster dose of either Tdap or Td (a different vaccine that protects against tetanus and diphtheria but not pertussis) every 10 years, or after 5 years in the case of a severe or dirty wound or burn. Tdap may be given at the same time as other vaccines. 3. Talk with your health care provider Tell your vaccine provider if the person getting the vaccine: Has had an allergic reaction after a previous dose of any vaccine that protects against tetanus, diphtheria, or pertussis, or has any  severe, life-threatening allergies Has had a coma, decreased level of consciousness, or prolonged seizures within 7 days after a previous dose of any pertussis vaccine (DTP, DTaP, or Tdap) Has seizures or another nervous system problem Has ever had Guillain-Barr Syndrome (also called "GBS") Has had severe pain or swelling after a previous dose of any vaccine that protects against tetanus or diphtheria In some cases, your health care provider may decide to postpone Tdapvaccination until a future visit. People with minor illnesses, such as a cold, may be vaccinated. People who are moderately or severely ill should usually wait until they recover beforegetting Tdap vaccine.  Your health care provider can give you more information. 4. Risks of a vaccine reaction Pain, redness, or swelling where the shot was given, mild fever, headache, feeling tired, and nausea, vomiting, diarrhea, or stomachache sometimes happen after Tdap vaccination. People sometimes faint after medical procedures, including vaccination. Tellyour provider if you feel dizzy or have vision changes or ringing in the ears.  As with any medicine, there is a very remote chance of a vaccine causing asevere allergic reaction, other serious injury, or death. 5. What if there is a serious problem? An allergic reaction could occur after the vaccinated person leaves the clinic. If you see signs of a severe allergic reaction (hives, swelling of the face and throat, difficulty breathing, a fast heartbeat, dizziness, or weakness), call [id number] get the person to the nearest hospital. For other signs that concern you, call your health care provider.  Adverse reactions should be reported to the Vaccine Adverse Event Reporting System (VAERS). Your health care provider will usually file this report, or you can do it yourself. Visit the   VAERS website at www.vaers.hhs.gov or call 1-800-822-7967. VAERS is only for reporting reactions, and VAERS staff  members do not give medical advice. 6. The National Vaccine Injury Compensation Program The National Vaccine Injury Compensation Program (VICP) is a federal program that was created to compensate people who may have been injured by certain vaccines. Claims regarding alleged injury or death due to vaccination have a time limit for filing, which may be as short as two years. Visit the VICP website at www.hrsa.gov/vaccinecompensation or call 1-800-338-2382to learn about the program and about filing a claim. 7. How can I learn more? Ask your health care provider. Call your local or state health department. Visit the website of the Food and Drug Administration (FDA) for vaccine package inserts and additional information at www.fda.gov/vaccines-blood-biologics/vaccines. Contact the Centers for Disease Control and Prevention (CDC): Call 1-800-232-4636 (1-800-CDC-INFO) or Visit CDC's website at www.cdc.gov/vaccines. Vaccine Information Statement Tdap (Tetanus, Diphtheria, Pertussis) Vaccine(09/02/2019) This information is not intended to replace advice given to you by your health care provider. Make sure you discuss any questions you have with your healthcare provider. Document Revised: 09/28/2019 Document Reviewed: 09/28/2019 Elsevier Patient Education  2022 Elsevier Inc.  

## 2021-02-12 NOTE — Progress Notes (Signed)
Patient is here for tdap.

## 2021-02-20 ENCOUNTER — Other Ambulatory Visit: Payer: Self-pay

## 2021-02-20 ENCOUNTER — Other Ambulatory Visit: Payer: Self-pay | Admitting: Allergy

## 2021-02-20 ENCOUNTER — Encounter: Payer: Self-pay | Admitting: Allergy

## 2021-02-20 ENCOUNTER — Ambulatory Visit (INDEPENDENT_AMBULATORY_CARE_PROVIDER_SITE_OTHER): Payer: BC Managed Care – PPO | Admitting: Allergy

## 2021-02-20 DIAGNOSIS — J310 Chronic rhinitis: Secondary | ICD-10-CM

## 2021-02-20 DIAGNOSIS — J45990 Exercise induced bronchospasm: Secondary | ICD-10-CM | POA: Diagnosis not present

## 2021-02-20 DIAGNOSIS — J3089 Other allergic rhinitis: Secondary | ICD-10-CM

## 2021-02-20 MED ORDER — LEVOCETIRIZINE DIHYDROCHLORIDE 5 MG PO TABS: ORAL_TABLET | ORAL | 1 refills | Status: DC

## 2021-02-20 MED ORDER — FAMOTIDINE 20 MG PO TABS: 20.0000 mg | ORAL_TABLET | Freq: Two times a day (BID) | ORAL | 5 refills | Status: DC

## 2021-02-20 MED ORDER — ALBUTEROL SULFATE HFA 108 (90 BASE) MCG/ACT IN AERS
2.0000 | INHALATION_SPRAY | Freq: Four times a day (QID) | RESPIRATORY_TRACT | 1 refills | Status: DC | PRN
Start: 2021-02-20 — End: 2021-02-20

## 2021-02-20 MED ORDER — QVAR REDIHALER 80 MCG/ACT IN AERB
2.0000 | INHALATION_SPRAY | Freq: Two times a day (BID) | RESPIRATORY_TRACT | 5 refills | Status: DC
Start: 2021-02-20 — End: 2021-11-28

## 2021-02-20 MED ORDER — IPRATROPIUM BROMIDE 0.06 % NA SOLN
2.0000 | Freq: Four times a day (QID) | NASAL | 5 refills | Status: DC
Start: 2021-02-20 — End: 2021-11-28

## 2021-02-20 MED ORDER — MONTELUKAST SODIUM 10 MG PO TABS: ORAL_TABLET | ORAL | 5 refills | Status: DC

## 2021-02-20 MED ORDER — FLUTICASONE PROPIONATE HFA 110 MCG/ACT IN AERO: 2.0000 | INHALATION_SPRAY | Freq: Two times a day (BID) | RESPIRATORY_TRACT | 5 refills | Status: DC

## 2021-02-20 NOTE — Addendum Note (Signed)
Addended by: Eloy End D on: 02/20/2021 05:06 PM   Modules accepted: Orders

## 2021-02-20 NOTE — Progress Notes (Signed)
Follow-up Note  RE: Belinda Barton MRN: [id number] DOB: 09/17/1985 Date of Office Visit: 02/20/2021   History of present illness: Belinda Barton is a 36 y.o. female presenting today for follow-up of asthma, allergic rhinitis, gustatory rhinitis.  She was last seen in the office on 06/08/2020 by myself.  She has not had any major health changes, surgeries or hospitalizations since this visit.  She has not had any issues with asthma that required ED/UC visit or systemic steroids since her last visit.   She states when the weather changes and gets too cold she will need to use albuterol for couple days about once a month.  She has needed to use albuterol a little bit more this winter as the weather has been changing frequently.  She is still using Qvar in the mornings and sometimes will use a second dose in day if needed if she has had more symptoms.  She also states if she eats dairy/cheese also around the time of weather changes it does worsen her asthma symptoms.   She continues to use xyzal daily.  She does use ipratropium when she consumes dairy and it is helpful.  She feels like she needs to use the ipratropium a bit more.  She does wake up in the morning with more drainage.  She will then use the ipratropium if that occurs and it does help.  She takes pepcid daily to help with reflux control.  It is very helpful.  She is expecting a baby in 4 weeks with her partner!  Review of systems in the past 4 weeks: Review of Systems  Constitutional: Negative.   HENT:         See HPI  Eyes: Negative.   Respiratory: Negative.    Cardiovascular: Negative.   Gastrointestinal: Negative.   Musculoskeletal: Negative.   Skin: Negative.   Allergic/Immunologic: Negative.   Neurological: Negative.     All other systems negative unless noted above in HPI  Past medical/social/surgical/family history have been reviewed and are unchanged unless specifically indicated below.  No  changes  Medication List: Current Outpatient Medications  Medication Sig Dispense Refill   albuterol (VENTOLIN HFA) 108 (90 Base) MCG/ACT inhaler Inhale 2 puffs into the lungs every 6 (six) hours as needed for wheezing or shortness of breath. 18 g 1   beclomethasone (QVAR REDIHALER) 80 MCG/ACT inhaler Inhale 2 puffs into the lungs 2 (two) times daily. 10.6 g 1   busPIRone (BUSPAR) 15 MG tablet Take 1 tablet (15 mg total) by mouth 2 (two) times daily. 180 tablet 1   famotidine (PEPCID) 20 MG tablet Take 1 tablet (20 mg total) by mouth 2 (two) times daily. 60 tablet 5   ipratropium (ATROVENT) 0.06 % nasal spray Place 2 sprays into both nostrils 4 (four) times daily. 15 mL 5   levocetirizine (XYZAL) 5 MG tablet TAKE 1 TABLET(5 MG) BY MOUTH EVERY EVENING 90 tablet 1   montelukast (SINGULAIR) 10 MG tablet TAKE 1 TABLET BY MOUTH EVERY DAY AT BEDTIME 30 tablet 0   valACYclovir (VALTREX) 1000 MG tablet Take 1,000 mg by mouth as needed.     Vitamin D, Ergocalciferol, (DRISDOL) 1.25 MG (50000 UNIT) CAPS capsule TAKE 1CAPSULE BY MOUTH WEEKLY 24 capsule 0   No current facility-administered medications for this visit.     Known medication allergies: Allergies  Allergen Reactions   Cheese Shortness Of Breath    Self diagnosed   Penicillins      Physical examination: Blood pressure  120/82, pulse 93, temperature 97.9 F (36.6 C), temperature source Temporal, resp. rate 12, height 5' 3.78" (1.62 m), weight 246 lb (111.6 kg), SpO2 98 %.  General: Alert, interactive, in no acute distress. HEENT: PERRLA, TMs pearly gray, turbinates moderately edematous with clear discharge, post-pharynx non erythematous. Neck: Supple without lymphadenopathy. Lungs: Clear to auscultation without wheezing, rhonchi or rales. {no increased work of breathing. CV: Normal S1, S2 without murmurs. Abdomen: Nondistended, nontender. Skin: Warm and dry, without lesions or rashes. Extremities:  No clubbing, cyanosis or  edema. Neuro:   Grossly intact.  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    Your milk serum IgE and cheese (moldy type) IgE were negative on previous blood testing and previous skin testing to milk was negative.     - your food allergy testing did show positive levels to nuts, shellfish, soybean, wheat, sesame seed.  You are able to eat these foods without symptoms thus you are most likely sensitized (see below).  Would keep in the diet to maintain tolerance.  - we have discussed the following in regards to foods:   Allergy: food allergy is when you have eaten a food, developed an allergic reaction after eating the food and have IgE to the food (positive food testing either by skin testing or blood testing).  Food allergy could lead to life threatening symptoms  Sensitivity: occurs when you have IgE to a food (positive food testing either by skin testing or blood testing) but is a food you eat without any issues.  This is not an allergy and we recommend keeping the food in the diet  Intolerance: this is when you have negative testing by either skin testing or blood testing thus not allergic but the food causes symptoms (like belly pain, bloating, diarrhea etc) with ingestion.  These foods should be avoided to prevent symptoms.      -Pepcid 20mg  1-2 times a day for reflux control    -use nasal Atrovent 2 sprays as needed up to 3-4 times a day if your going to eat cheese or other foods or situations where you develop nasal congestion and drainage.  You can use prior to eating cheese to help decrease nasal drainage or after you eat cheeses to help dry up the nasal drainage.    Exercise-induced asthma/Reactive airway disease   - continue Singulair 10mg  daily-- take at bedtime   - use Qvar 68mcg 2 puffs twice a day    - have access to albuterol inhaler 2 puffs every 4-6 hours as  needed for cough/wheeze/shortness of breath/chest tightness.  May use 15-20 minutes prior to activity.   Monitor frequency of use.    Asthma control goals:  Full participation in all desired activities (may need albuterol before activity) Albuterol use two time or less a week on average (not counting use with activity) Cough interfering with sleep two time or less a month Oral steroids no more than once a year No hospitalizations  Allergic rhinitis    - continue Xyzal (levocetirizine) 5mg  daily as needed    - as above use nasal Atrovent and for allergy symptom control use 2 sprays each nostril twice a day (may use up to 4 times a day if needed for nasal drainage/congestion control)    - continue allergen avoidance grasses, weeds, trees, mold (botrytis cinera), dust mites, dog, cockroach.   Follow-up 6 months  or sooner if needed    I appreciate the opportunity to take part in Belinda Barton's care. Please do not hesitate to contact me with questions.  Sincerely,   Prudy Feeler, MD Allergy/Immunology [facility]

## 2021-02-20 NOTE — Patient Instructions (Addendum)
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    Your milk serum IgE and cheese (moldy type) IgE were negative on previous blood testing and previous skin testing to milk was negative.     - your food allergy testing did show positive levels to nuts, shellfish, soybean, wheat, sesame seed.  You are able to eat these foods without symptoms thus you are most likely sensitized (see below).  Would keep in the diet to maintain tolerance.  - we have discussed the following in regards to foods:   Allergy: food allergy is when you have eaten a food, developed an allergic reaction after eating the food and have IgE to the food (positive food testing either by skin testing or blood testing).  Food allergy could lead to life threatening symptoms  Sensitivity: occurs when you have IgE to a food (positive food testing either by skin testing or blood testing) but is a food you eat without any issues.  This is not an allergy and we recommend keeping the food in the diet  Intolerance: this is when you have negative testing by either skin testing or blood testing thus not allergic but the food causes symptoms (like belly pain, bloating, diarrhea etc) with ingestion.  These foods should be avoided to prevent symptoms.      -Pepcid 20mg  1-2 times a day for reflux control    -use nasal Atrovent 2 sprays as needed up to 3-4 times a day if your going to eat cheese or other foods or situations where you develop nasal congestion and drainage.  You can use prior to eating cheese to help decrease nasal drainage or after you eat cheeses to help dry up the nasal drainage.    Exercise-induced asthma/Reactive airway disease   - continue Singulair 10mg  daily-- take at bedtime   - use Qvar 2 puffs twice a day    - have access to albuterol inhaler 2 puffs every 4-6 hours as needed for cough/wheeze/shortness of breath/chest tightness.  May use 15-20 minutes prior to  activity.   Monitor frequency of use.    Asthma control goals:  Full participation in all desired activities (may need albuterol before activity) Albuterol use two time or less a week on average (not counting use with activity) Cough interfering with sleep two time or less a month Oral steroids no more than once a year No hospitalizations  Allergic rhinitis    - continue Xyzal (levocetirizine) 5mg  daily as needed    - as above use nasal Atrovent and for allergy symptom control use 2 sprays each nostril twice a day (may use up to 4 times a day if needed for nasal drainage/congestion control)    - continue allergen avoidance grasses, weeds, trees, mold (botrytis cinera), dust mites, dog, cockroach.   Follow-up 6 months or sooner if needed

## 2021-02-21 ENCOUNTER — Other Ambulatory Visit: Payer: Self-pay | Admitting: *Deleted

## 2021-02-21 MED ORDER — FLOVENT HFA 110 MCG/ACT IN AERO: 2.0000 | INHALATION_SPRAY | Freq: Two times a day (BID) | RESPIRATORY_TRACT | 5 refills | Status: DC

## 2021-03-15 ENCOUNTER — Other Ambulatory Visit: Payer: Self-pay | Admitting: Allergy

## 2021-05-16 ENCOUNTER — Ambulatory Visit (INDEPENDENT_AMBULATORY_CARE_PROVIDER_SITE_OTHER): Payer: BC Managed Care – PPO | Admitting: Nurse Practitioner

## 2021-05-16 ENCOUNTER — Encounter: Payer: Self-pay | Admitting: Nurse Practitioner

## 2021-05-16 VITALS — BP 130/70 | HR 82 | Temp 97.9°F | Ht 63.7 in | Wt 246.0 lb

## 2021-05-16 DIAGNOSIS — E559 Vitamin D deficiency, unspecified: Secondary | ICD-10-CM

## 2021-05-16 DIAGNOSIS — Z0000 Encounter for general adult medical examination without abnormal findings: Secondary | ICD-10-CM | POA: Diagnosis not present

## 2021-05-16 DIAGNOSIS — Z79899 Other long term (current) drug therapy: Secondary | ICD-10-CM

## 2021-05-16 DIAGNOSIS — Z6841 Body Mass Index (BMI) 40.0 and over, adult: Secondary | ICD-10-CM

## 2021-05-16 DIAGNOSIS — E7800 Pure hypercholesterolemia, unspecified: Secondary | ICD-10-CM

## 2021-05-16 DIAGNOSIS — F419 Anxiety disorder, unspecified: Secondary | ICD-10-CM

## 2021-05-16 NOTE — Patient Instructions (Signed)

## 2021-05-16 NOTE — Progress Notes (Signed)
?Industrial/product designer as a Education administrator for [employer], FNP.,have documented all relevant documentation on the behalf of Minette Brine, FNP,as directed by  Minette Brine, FNP while in the presence of Minette Brine, [place]. ? ?This visit occurred during the SARS-CoV-2 public health emergency.  Safety protocols were in place, including screening questions prior to the visit, additional usage of staff PPE, and extensive cleaning of exam room while observing appropriate contact time as indicated for disinfecting solutions. ? ?Subjective:  ?  ? Patient ID: Belinda Barton , female    DOB: [birth year] , 36 y.o.   MRN: [id number] ? ? ?Chief Complaint  ?Patient presents with  ? Annual Exam  ? ? ?HPI ? ?Here for HM. [facility]. She has seen Dr Nelva Bush for her allergies with no changes. She got married February 2022. Denies any chance of pregnancy, she is married to a female. She has been under a different type of stress. She and her wife had a baby 2 months a son - healthy.  ?  ? ?Past Medical History:  ?Diagnosis Date  ? Anxiety   ? Asthma   ? Herpes genitalis in women   ? Vitamin D deficiency   ?  ? ?Family History  ?Problem Relation Age of Onset  ? Diabetes Mother   ? Asthma Brother   ? ? ? ?Current Outpatient Medications:  ?  albuterol (VENTOLIN HFA) 108 (90 Base) MCG/ACT inhaler, INHALE 2 PUFFS INTO THE LUNGS EVERY 6 HOURS AS NEEDED FOR WHEEZING OR SHORTNESS OF BREATH, Disp: 54 g, Rfl: 1 ?  beclomethasone (QVAR REDIHALER) 80 MCG/ACT inhaler, Inhale 2 puffs into the lungs 2 (two) times daily., Disp: 10.6 g, Rfl: 5 ?  busPIRone (BUSPAR) 15 MG tablet, Take 1 tablet (15 mg total) by mouth 2 (two) times daily., Disp: 180 tablet, Rfl: 1 ?  famotidine (PEPCID) 20 MG tablet, TAKE 1 TABLET(20 MG) BY MOUTH TWICE DAILY, Disp: 180 tablet, Rfl: 5 ?  FLOVENT HFA 110 MCG/ACT inhaler, Inhale 2 puffs into the lungs in the morning and at bedtime., Disp: 12 g, Rfl: 5 ?  fluticasone (FLOVENT HFA) 110 MCG/ACT inhaler, Inhale 2  puffs into the lungs 2 (two) times daily., Disp: 1 each, Rfl: 5 ?  ipratropium (ATROVENT) 0.06 % nasal spray, Place 2 sprays into both nostrils 4 (four) times daily., Disp: 15 mL, Rfl: 5 ?  levocetirizine (XYZAL) 5 MG tablet, TAKE 1 TABLET(5 MG) BY MOUTH EVERY EVENING, Disp: 90 tablet, Rfl: 1 ?  montelukast (SINGULAIR) 10 MG tablet, TAKE 1 TABLET BY MOUTH EVERY DAY AT BEDTIME, Disp: 30 tablet, Rfl: 5 ?  valACYclovir (VALTREX) 1000 MG tablet, Take 1,000 mg by mouth as needed., Disp: , Rfl:  ?  Vitamin D, Ergocalciferol, (DRISDOL) 1.25 MG (50000 UNIT) CAPS capsule, TAKE 1CAPSULE BY MOUTH WEEKLY, Disp: 24 capsule, Rfl: 0  ? ?Allergies  ?Allergen Reactions  ? Cheese Shortness Of Breath  ?  Self diagnosed  ? Penicillins   ?  ? ? ?The patient states she uses none for birth control.  Patient's last menstrual period was 04/13/2021 (exact date).. Negative for Dysmenorrhea and Negative for Menorrhagia. Negative for: breast discharge, breast lump(s), breast pain and breast self exam. Associated symptoms include abnormal vaginal bleeding. Pertinent negatives include abnormal bleeding (hematology), anxiety, decreased libido, depression, difficulty falling sleep, dyspareunia, history of infertility, nocturia, sexual dysfunction, sleep disturbances, urinary incontinence, urinary urgency, vaginal discharge and vaginal itching. Diet regular.The patient states her exercise level is minimal.   ?  ?  The patient's tobacco use is:  ?Social History  ? ?Tobacco Use  ?Smoking Status Former  ?Smokeless Tobacco Never  ? ?She has been exposed to passive smoke. The patient's alcohol use is:  ?Social History  ? ?Substance and Sexual Activity  ?Alcohol Use Yes  ? ?Additional information: Last pap 09/11/2020, next one scheduled for 09/12/2023.   ? ?Review of Systems  ?Constitutional: Negative.   ?HENT: Negative.    ?Eyes: Negative.   ?Respiratory: Negative.    ?Cardiovascular: Negative.   ?Gastrointestinal: Negative.   ?Endocrine: Negative.    ?Genitourinary: Negative.   ?Musculoskeletal: Negative.   ?Skin: Negative.   ?Allergic/Immunologic: Negative.   ?Neurological: Negative.   ?Hematological: Negative.   ?Psychiatric/Behavioral: Negative.     ? ?Today's Vitals  ? 05/16/21 0930  ?BP: 130/70  ?Pulse: 82  ?Temp: 97.9 ?F (36.6 ?C)  ?TempSrc: Oral  ?Weight: 246 lb (111.6 kg)  ?Height: 5' 3.7" (1.618 m)  ? ?Body mass index is 42.62 kg/m?.  ?Wt Readings from Last 3 Encounters:  ?05/16/21 246 lb (111.6 kg)  ?02/20/21 246 lb (111.6 kg)  ?02/12/21 245 lb (111.1 kg)  ? ? ?Objective:  ?Physical Exam ?Vitals reviewed.  ?Constitutional:   ?   General: She is not in acute distress. ?   Appearance: Normal appearance. She is well-developed. She is obese.  ?HENT:  ?   Head: Normocephalic and atraumatic.  ?   Right Ear: Hearing, tympanic membrane, ear canal and external ear normal. There is no impacted cerumen.  ?   Left Ear: Hearing, tympanic membrane, ear canal and external ear normal. There is no impacted cerumen.  ?   Nose:  ?   Comments: Deferred - mask ? ?   Mouth/Throat:  ?   Comments: Deferred - mask ?Eyes:  ?   General: Lids are normal.  ?   Extraocular Movements: Extraocular movements intact.  ?   Conjunctiva/sclera: Conjunctivae normal.  ?   Pupils: Pupils are equal, round, and reactive to light.  ?   Funduscopic exam: ?   Right eye: No papilledema.     ?   Left eye: No papilledema.  ?Neck:  ?   Thyroid: No thyroid mass.  ?   Vascular: No carotid bruit.  ?Cardiovascular:  ?   Rate and Rhythm: Normal rate and regular rhythm.  ?   Pulses: Normal pulses.  ?   Heart sounds: Normal heart sounds. No murmur heard. ?Pulmonary:  ?   Effort: Pulmonary effort is normal. No respiratory distress.  ?   Breath sounds: Normal breath sounds. No wheezing.  ?Abdominal:  ?   General: Abdomen is flat. Bowel sounds are normal. There is no distension.  ?   Palpations: Abdomen is soft.  ?   Tenderness: There is no abdominal tenderness.  ?Musculoskeletal:     ?   General: No swelling  or tenderness. Normal range of motion.  ?   Cervical back: Full passive range of motion without pain, normal range of motion and neck supple.  ?   Right lower leg: No edema.  ?   Left lower leg: No edema.  ?Skin: ?   General: Skin is warm and dry.  ?   Capillary Refill: Capillary refill takes less than 2 seconds.  ?Neurological:  ?   General: No focal deficit present.  ?   Mental Status: She is alert and oriented to person, place, and time.  ?   Cranial Nerves: No cranial nerve deficit.  ?  Sensory: No sensory deficit.  ?   Motor: No weakness.  ?Psychiatric:     ?   Mood and Affect: Mood normal.     ?   Behavior: Behavior normal.     ?   Thought Content: Thought content normal.     ?   Judgment: Judgment normal.  ?  ? ?   ?Assessment And Plan:  ?   ?1. Health maintenance examination ?Behavior modifications discussed and diet history reviewed.   ?Pt will continue to exercise regularly and modify diet with low GI, plant based foods and decrease intake of processed foods.  ?Recommend intake of daily multivitamin, Vitamin D, and calcium.  ?Recommend colonoscopy for preventive screenings, as well as recommend immunizations that include influenza, TDAP ?Manual breast exam done in office ? ?2. Vitamin D deficiency ?Will check vitamin D level and supplement as needed.    ?Also encouraged to spend 15 minutes in the sun daily.  ?- VITAMIN D 25 Hydroxy (Vit-D Deficiency, Fractures) ? ?3. Class 3 severe obesity due to excess calories without serious comorbidity with body mass index (BMI) of 42.0 to 44.9 in adult [facility]) ?Chronic ?Discussed healthy diet and regular exercise options  ?Encouraged to exercise at least 150 minutes per week with 2 days of strength training ? ?4. Elevated LDL cholesterol level ?Comments: LDL is slightly elevated but not too elevated, advised to continue to limit fried and fatty foods. ?- CMP14+EGFR ?- CBC ?- Lipid panel ? ?5. Anxiety ?Comments: She is mostly controlled, however she has been off work  for 2 months due to having a new baby. Can take Ashwaganda supplement to help  ? ?6. Other long term (current) drug therapy ?- CBC ?She is encouraged to strive for BMI less than 30 to decrease cardiac risk. Harvin Hazel

## 2021-05-17 LAB — CMP14+EGFR
ALT: 18 IU/L (ref 0–32)
AST: 22 IU/L (ref 0–40)
Albumin/Globulin Ratio: 1.6 (ref 1.2–2.2)
Albumin: 4.4 g/dL (ref 3.8–4.8)
Alkaline Phosphatase: 58 IU/L (ref 44–121)
BUN/Creatinine Ratio: 15 (ref 9–23)
BUN: 14 mg/dL (ref 6–20)
Bilirubin Total: 0.5 mg/dL (ref 0.0–1.2)
CO2: 23 mmol/L (ref 20–29)
Calcium: 9 mg/dL (ref 8.7–10.2)
Chloride: 103 mmol/L (ref 96–106)
Creatinine, Ser: 0.93 mg/dL (ref 0.57–1.00)
Globulin, Total: 2.8 g/dL (ref 1.5–4.5)
Glucose: 93 mg/dL (ref 70–99)
Potassium: 4.1 mmol/L (ref 3.5–5.2)
Sodium: 138 mmol/L (ref 134–144)
Total Protein: 7.2 g/dL (ref 6.0–8.5)
eGFR: 83 mL/min/{1.73_m2} (ref >59)

## 2021-05-17 LAB — CBC
Hematocrit: 42.2 % (ref 34.0–46.6)
Hemoglobin: 14 g/dL (ref 11.1–15.9)
MCH: 29.4 pg (ref 26.6–33.0)
MCHC: 33.2 g/dL (ref 31.5–35.7)
MCV: 89 fL (ref 79–97)
Platelets: 280 10*3/uL (ref 150–450)
RBC: 4.77 x10E6/uL (ref 3.77–5.28)
RDW: 13.6 % (ref 11.7–15.4)
WBC: 6.3 10*3/uL (ref 3.4–10.8)

## 2021-05-17 LAB — LIPID PANEL
Chol/HDL Ratio: 3.6 ratio (ref 0.0–4.4)
Cholesterol, Total: 180 mg/dL (ref 100–199)
HDL: 50 mg/dL (ref >39)
LDL Chol Calc (NIH): 114 mg/dL — ABNORMAL HIGH (ref 0–99)
Triglycerides: 89 mg/dL (ref 0–149)
VLDL Cholesterol Cal: 16 mg/dL (ref 5–40)

## 2021-05-17 LAB — VITAMIN D 25 HYDROXY (VIT D DEFICIENCY, FRACTURES): Vit D, 25-Hydroxy: 25.5 ng/mL — ABNORMAL LOW (ref 30.0–100.0)

## 2021-06-11 IMAGING — CR DG CERVICAL SPINE COMPLETE 4+V
5 series · 5 of 5 positions shown · non-contrast
Comparison: None.

CLINICAL DATA: Right-sided neck pain.

EXAM:
CERVICAL SPINE - COMPLETE 4+ VIEW

[w cervical spine lat]
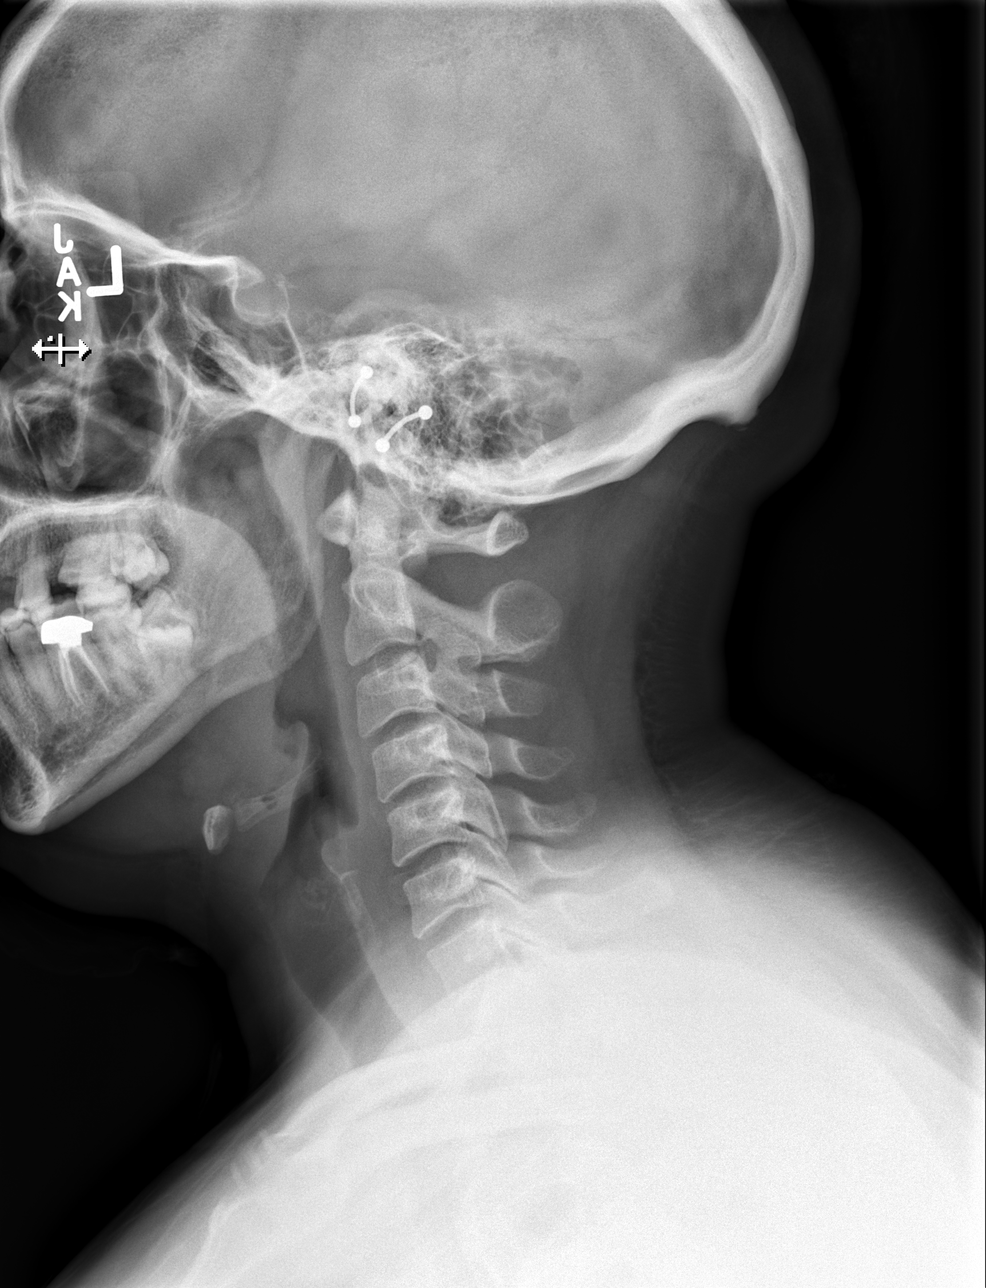

[w cervical spine ap_obl (1 of 2)]
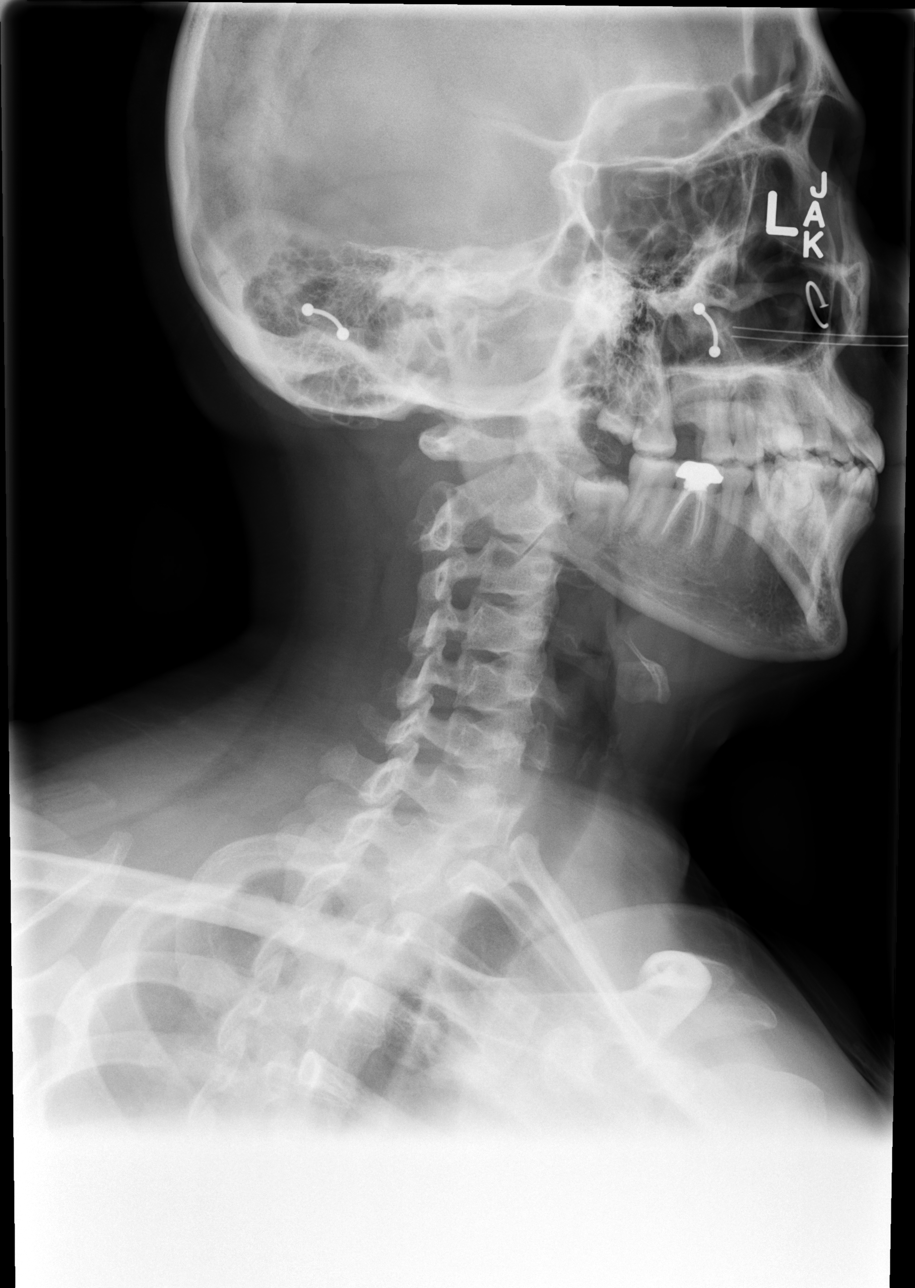

[w cervical spine ap_obl (2 of 2)]
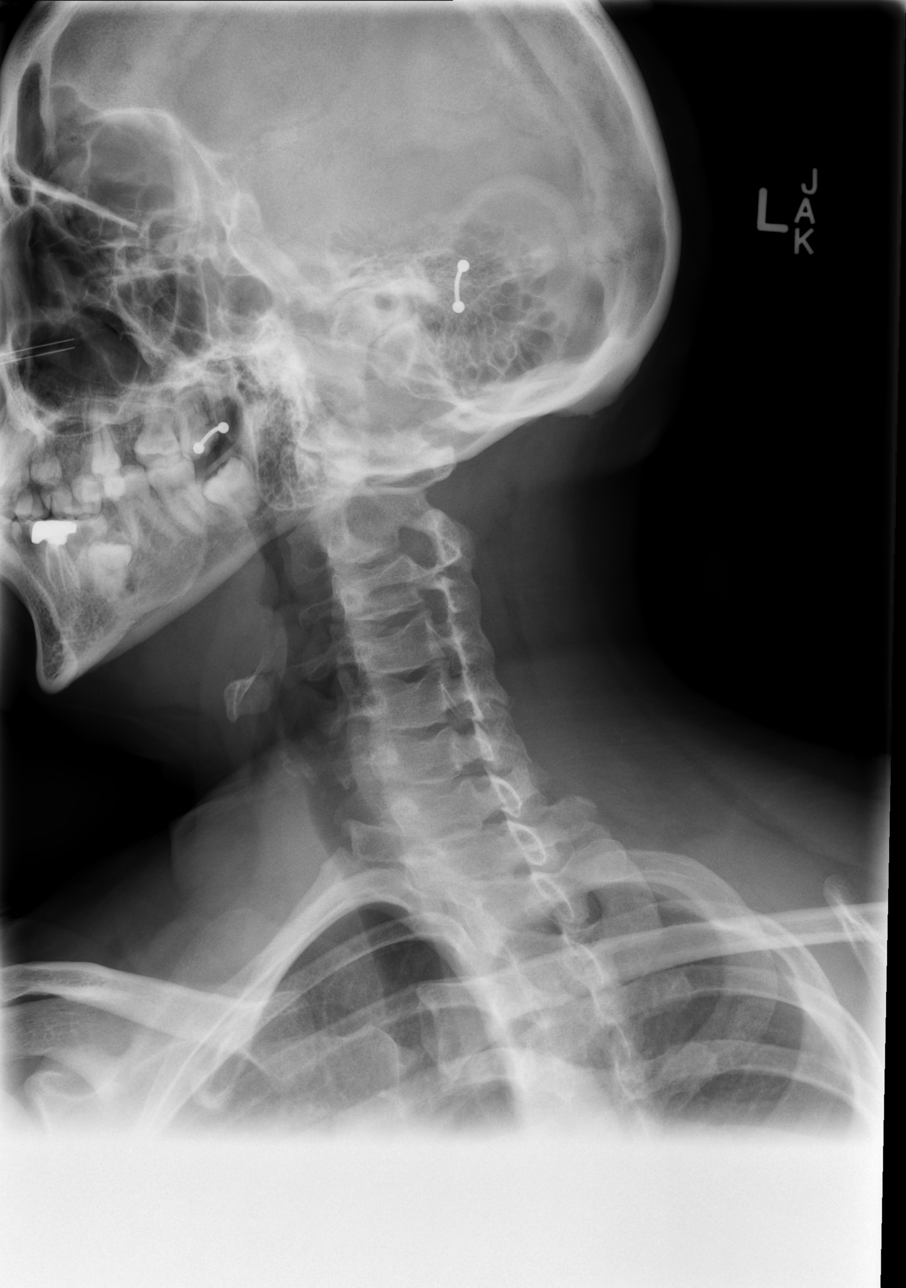

[w cervical spine ap (1 of 2)]
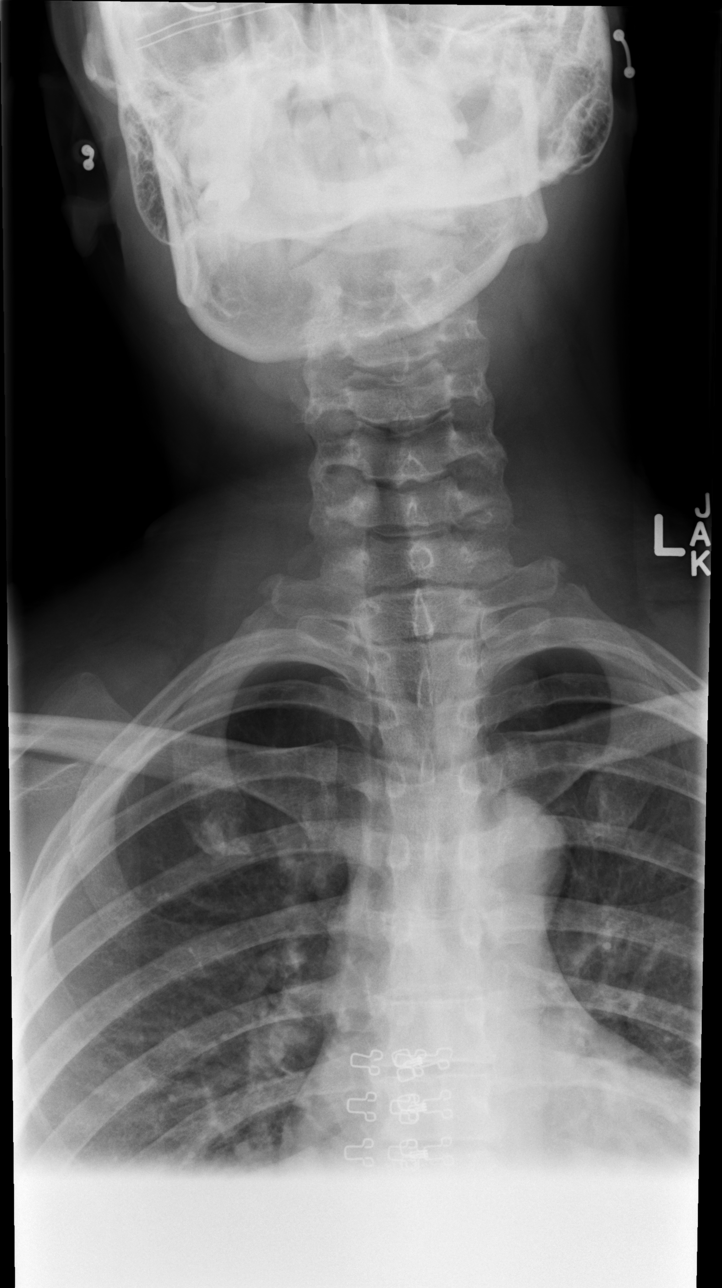

[w cervical spine ap (2 of 2)]
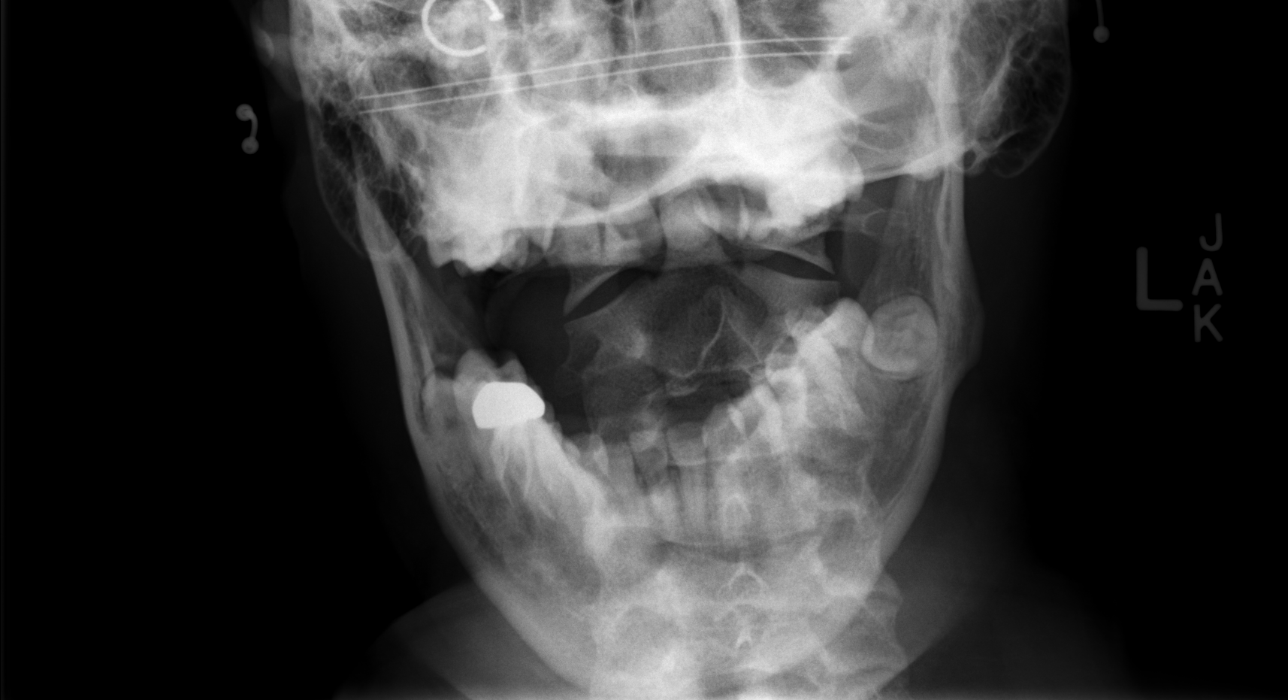

[5 of 5 positions shown; findings below may reference images not displayed]

FINDINGS: There is no evidence of cervical spine fracture or prevertebral soft
tissue swelling. Alignment is normal. No other significant bone
abnormalities are identified.
IMPRESSION: Negative cervical spine radiographs.

## 2021-07-03 ENCOUNTER — Other Ambulatory Visit: Payer: Self-pay

## 2021-07-03 DIAGNOSIS — F419 Anxiety disorder, unspecified: Secondary | ICD-10-CM

## 2021-07-03 MED ORDER — BUSPIRONE HCL 15 MG PO TABS: 15.0000 mg | ORAL_TABLET | Freq: Two times a day (BID) | ORAL | 1 refills | Status: AC

## 2021-08-21 ENCOUNTER — Ambulatory Visit: Payer: BC Managed Care – PPO | Admitting: Allergy

## 2021-11-28 ENCOUNTER — Telehealth: Payer: Self-pay | Admitting: *Deleted

## 2021-11-28 ENCOUNTER — Encounter: Payer: Self-pay | Admitting: Allergy

## 2021-11-28 ENCOUNTER — Ambulatory Visit (INDEPENDENT_AMBULATORY_CARE_PROVIDER_SITE_OTHER): Payer: BC Managed Care – PPO | Admitting: Allergy

## 2021-11-28 VITALS — BP 114/70 | HR 93 | Temp 97.9°F | Resp 18 | Ht 63.0 in | Wt 242.2 lb

## 2021-11-28 DIAGNOSIS — J45990 Exercise induced bronchospasm: Secondary | ICD-10-CM

## 2021-11-28 DIAGNOSIS — J3089 Other allergic rhinitis: Secondary | ICD-10-CM | POA: Diagnosis not present

## 2021-11-28 DIAGNOSIS — T781XXD Other adverse food reactions, not elsewhere classified, subsequent encounter: Secondary | ICD-10-CM

## 2021-11-28 DIAGNOSIS — J310 Chronic rhinitis: Secondary | ICD-10-CM

## 2021-11-28 MED ORDER — IPRATROPIUM BROMIDE 0.06 % NA SOLN
2.0000 | Freq: Four times a day (QID) | NASAL | 5 refills | Status: AC
Start: 2021-11-28 — End: ?

## 2021-11-28 MED ORDER — QVAR REDIHALER 80 MCG/ACT IN AERB: 2.0000 | INHALATION_SPRAY | Freq: Two times a day (BID) | RESPIRATORY_TRACT | 5 refills | Status: AC

## 2021-11-28 MED ORDER — LEVOCETIRIZINE DIHYDROCHLORIDE 5 MG PO TABS: ORAL_TABLET | ORAL | 1 refills | Status: AC

## 2021-11-28 MED ORDER — MONTELUKAST SODIUM 10 MG PO TABS: ORAL_TABLET | ORAL | 5 refills | Status: DC

## 2021-11-28 MED ORDER — ALBUTEROL SULFATE HFA 108 (90 BASE) MCG/ACT IN AERS: 2.0000 | INHALATION_SPRAY | Freq: Four times a day (QID) | RESPIRATORY_TRACT | 1 refills | Status: AC | PRN

## 2021-11-28 MED ORDER — FAMOTIDINE 20 MG PO TABS: 20.0000 mg | ORAL_TABLET | Freq: Two times a day (BID) | ORAL | 1 refills | Status: AC

## 2021-11-28 NOTE — Progress Notes (Signed)
Follow-up Note  RE: Lakhia Gengler MRN: [id number] DOB: 1985/11/01 Date of Office Visit: 11/28/2021   History of present illness: Belinda Barton is a 36 y.o. female presenting today for follow-up of exercise induced asthma, allergic rhinitis and adverse food reaction/gustatory rhinitis.  She was last seen in the office on 02/20/21 by myself.  She has a new addition to her family with her 34mo old son!  She states she has had a good year without major health changes surgeries or hospitalizations.  With her breathing she states has been under control as she has been better about being consistent with her maintenance medications.  She has been taking singulair daily and Qvar 2 puffs twice daily.  She states she can tell a differences being consistent vs when she misses doses.  She has not had any ED/UC visits or systemic steroid needs since last visit.   With her allergies things have been controlled with Xyzal and Atrovent use.  She also states she has been inside more this year with her newborn so this also was helpful in minimizing allergy symptoms.  She does use atrovent to help with gustatory rhinitis symptoms after eating.     Review of systems: Review of Systems  Constitutional: Negative.   HENT: Negative.    Eyes: Negative.   Respiratory: Negative.    Cardiovascular: Negative.   Gastrointestinal: Negative.   Musculoskeletal: Negative.   Skin: Negative.   Allergic/Immunologic: Negative.   Neurological: Negative.      All other systems negative unless noted above in HPI  Past medical/social/surgical/family history have been reviewed and are unchanged unless specifically indicated below.  No changes  Medication List: Current Outpatient Medications  Medication Sig Dispense Refill   albuterol (VENTOLIN HFA) 108 (90 Base) MCG/ACT inhaler INHALE 2 PUFFS INTO THE LUNGS EVERY 6 HOURS AS NEEDED FOR WHEEZING OR SHORTNESS OF BREATH 54 g 1   beclomethasone (QVAR REDIHALER) 80 MCG/ACT  inhaler Inhale 2 puffs into the lungs 2 (two) times daily. 10.6 g 5   busPIRone (BUSPAR) 15 MG tablet Take 1 tablet (15 mg total) by mouth 2 (two) times daily. 180 tablet 1   famotidine (PEPCID) 20 MG tablet TAKE 1 TABLET(20 MG) BY MOUTH TWICE DAILY 180 tablet 5   ipratropium (ATROVENT) 0.06 % nasal spray Place 2 sprays into both nostrils 4 (four) times daily. 15 mL 5   levocetirizine (XYZAL) 5 MG tablet TAKE 1 TABLET(5 MG) BY MOUTH EVERY EVENING 90 tablet 1   montelukast (SINGULAIR) 10 MG tablet TAKE 1 TABLET BY MOUTH EVERY DAY AT BEDTIME 30 tablet 5   valACYclovir (VALTREX) 1000 MG tablet Take 1,000 mg by mouth as needed.     Vitamin D, Ergocalciferol, (DRISDOL) 1.25 MG (50000 UNIT) CAPS capsule TAKE 1CAPSULE BY MOUTH WEEKLY 24 capsule 0   No current facility-administered medications for this visit.     Known medication allergies: Allergies  Allergen Reactions   Cheese Shortness Of Breath    Self diagnosed   Penicillins      Physical examination: Blood pressure 114/70, pulse 93, temperature 97.9 F (36.6 C), temperature source Temporal, resp. rate 18, height 5\' 3"  (1.6 m), weight 242 lb 3.2 oz (109.9 kg), SpO2 99 %.  General: Alert, interactive, in no acute distress. HEENT: PERRLA, TMs pearly gray, turbinates non-edematous with clear discharge, post-pharynx non erythematous. Neck: Supple without lymphadenopathy. Lungs: Clear to auscultation without wheezing, rhonchi or rales. {no increased work of breathing. CV: Normal S1, S2 without murmurs. Abdomen: Nondistended,  nontender. Skin: Warm and dry, without lesions or rashes. Extremities:  No clubbing, cyanosis or edema. Neuro:   Grossly intact.  Diagnositics/Labs: Spirometry: FEV1: 1.96L 77%, FVC: 2.74L 90%, ratio consistent with nonobstructive pattern  Assessment and plan: Exercise-induced asthma/Reactive airway disease   - continue Singulair 10mg  daily-- take at bedtime   - use Qvar 40mcg 2 puffs twice a day for the winter  months.   For spring-fall (can start around March) can decrease to Qvar 1 puff twice a day.    - have access to albuterol inhaler 2 puffs every 4-6 hours as needed for cough/wheeze/shortness of breath/chest tightness.  May use 15-20 minutes prior to activity.   Monitor frequency of use.    Asthma control goals:  Full participation in all desired activities (may need albuterol before activity) Albuterol use two time or less a week on average (not counting use with activity) Cough interfering with sleep two time or less a month Oral steroids no more than once a year No hospitalizations  Adverse food reaction/Gustatory Rhinitis    - nasal congestion with food ingestion is gustatory rhinitis.    Your milk serum IgE and cheese (moldy type) IgE were negative on previous blood testing and previous skin testing to milk was negative.     - your food allergy testing did show positive levels to nuts, shellfish, soybean, wheat, sesame seed.  You are able to eat these foods without symptoms thus you are most likely sensitized (see below).  Would keep in the diet to maintain tolerance.  - we have discussed the following in regards to foods:   Allergy: food allergy is when you have eaten a food, developed an allergic reaction after eating the food and have IgE to the food (positive food testing either by skin testing or blood testing).  Food allergy could lead to life threatening symptoms  Sensitivity: occurs when you have IgE to a food (positive food testing either by skin testing or blood testing) but is a food you eat without any issues.  This is not an allergy and we recommend keeping the food in the diet  Intolerance: this is when you have negative testing by either skin testing or blood testing thus not allergic but the food causes symptoms (like belly pain, bloating, diarrhea etc) with ingestion.  These foods should be avoided to prevent symptoms.      -Pepcid 20mg  1-2 times a day for reflux control     -use nasal Atrovent 2 sprays as needed up to 3-4 times a day if your going to eat cheese or other foods or situations where you develop nasal congestion and drainage.  You can use prior to eating cheese to help decrease nasal drainage or after you eat cheeses to help dry up the nasal drainage.    Allergic rhinitis    - continue Xyzal (levocetirizine) 5mg  daily as needed    - as above use nasal Atrovent and for allergy symptom control use 2 sprays each nostril twice a day (may use up to 4 times a day if needed for nasal drainage/congestion control)    - continue allergen avoidance grasses, weeds, trees, mold (botrytis cinera), dust mites, dog, cockroach.   Follow-up April 2024 or sooner if needed  I appreciate the opportunity to take part in Janina's care. Please do not hesitate to contact me with questions.  Sincerely,     Prudy Feeler, MD Allergy/Immunology [facility]

## 2021-11-28 NOTE — Patient Instructions (Addendum)
Exercise-induced asthma/Reactive airway disease   - continue Singulair 10mg  daily-- take at bedtime   - use Qvar 1mcg 2 puffs twice a day for the winter months.   For spring-fall (can start around March) can decrease to Qvar 1 puff twice a day.    - have access to albuterol inhaler 2 puffs every 4-6 hours as needed for cough/wheeze/shortness of breath/chest tightness.  May use 15-20 minutes prior to activity.   Monitor frequency of use.    Asthma control goals:  Full participation in all desired activities (may need albuterol before activity) Albuterol use two time or less a week on average (not counting use with activity) Cough interfering with sleep two time or less a month Oral steroids no more than once a year No hospitalizations  Adverse food reaction/Gustatory Rhinitis    - nasal congestion with food ingestion is gustatory rhinitis.    Your milk serum IgE and cheese (moldy type) IgE were negative on previous blood testing and previous skin testing to milk was negative.     - your food allergy testing did show positive levels to nuts, shellfish, soybean, wheat, sesame seed.  You are able to eat these foods without symptoms thus you are most likely sensitized (see below).  Would keep in the diet to maintain tolerance.  - we have discussed the following in regards to foods:   Allergy: food allergy is when you have eaten a food, developed an allergic reaction after eating the food and have IgE to the food (positive food testing either by skin testing or blood testing).  Food allergy could lead to life threatening symptoms  Sensitivity: occurs when you have IgE to a food (positive food testing either by skin testing or blood testing) but is a food you eat without any issues.  This is not an allergy and we recommend keeping the food in the diet  Intolerance: this is when you have negative testing by either skin testing or blood testing thus not allergic but the food causes symptoms (like belly  pain, bloating, diarrhea etc) with ingestion.  These foods should be avoided to prevent symptoms.      -Pepcid 20mg  1-2 times a day for reflux control    -use nasal Atrovent 2 sprays as needed up to 3-4 times a day if your going to eat cheese or other foods or situations where you develop nasal congestion and drainage.  You can use prior to eating cheese to help decrease nasal drainage or after you eat cheeses to help dry up the nasal drainage.    Allergic rhinitis    - continue Xyzal (levocetirizine) 5mg  daily as needed    - as above use nasal Atrovent and for allergy symptom control use 2 sprays each nostril twice a day (may use up to 4 times a day if needed for nasal drainage/congestion control)    - continue allergen avoidance grasses, weeds, trees, mold (botrytis cinera), dust mites, dog, cockroach.   Follow-up April 2024 or sooner if needed

## 2021-11-28 NOTE — Telephone Encounter (Signed)
PA has been submitted through CoverMyMeds for QVAR and is currently pending approval/denial.  

## 2021-11-28 NOTE — Telephone Encounter (Signed)
Received a fax from pharmacy stating that QVAR is not covered and that preferred alternative is Pulmicort. Would you like to switch or proceed with a PA?

## 2021-11-29 ENCOUNTER — Other Ambulatory Visit: Payer: Self-pay | Admitting: *Deleted

## 2021-11-29 MED ORDER — PULMICORT FLEXHALER 180 MCG/ACT IN AEPB: 1.0000 | INHALATION_SPRAY | Freq: Two times a day (BID) | RESPIRATORY_TRACT | 5 refills | Status: AC

## 2021-11-29 NOTE — Telephone Encounter (Signed)
Prescription has been sent in. Called the patient and informed. Patient verbalized understanding.  

## 2021-11-29 NOTE — Telephone Encounter (Signed)
PA for QVAR has been DENIED. Denial letter has been attached in patient documents.

## 2021-11-29 NOTE — Telephone Encounter (Signed)
Would you like to go ahead and send in the prescription for Pulmicort?

## 2022-05-07 ENCOUNTER — Encounter: Payer: Self-pay | Admitting: Allergy

## 2022-05-21 ENCOUNTER — Encounter: Payer: BC Managed Care – PPO | Admitting: Nurse Practitioner

## 2022-05-29 ENCOUNTER — Ambulatory Visit: Payer: BC Managed Care – PPO | Admitting: Allergy

## 2022-07-01 ENCOUNTER — Encounter: Payer: BC Managed Care – PPO | Admitting: Nurse Practitioner

## 2022-07-04 ENCOUNTER — Other Ambulatory Visit: Payer: Self-pay | Admitting: Allergy

## 2022-08-06 ENCOUNTER — Telehealth: Payer: Self-pay | Admitting: Allergy

## 2022-08-06 NOTE — Telephone Encounter (Signed)
Patient called and stated that she is no longer living in the state of [place]. She now resides in the state of [place]
